# Patient Record
Sex: Male | Born: 1965 | Race: Black or African American | Hispanic: No | Marital: Single | Smoking: Current every day smoker
Health system: Southern US, Community
[De-identification: ages and names within clinical notes are randomized; demographics above are authoritative.]

## PROBLEM LIST (undated history)

## (undated) DIAGNOSIS — T286XXA Corrosion of esophagus, initial encounter: Secondary | ICD-10-CM

## (undated) DIAGNOSIS — K219 Gastro-esophageal reflux disease without esophagitis: Secondary | ICD-10-CM

## (undated) DIAGNOSIS — T6591XA Toxic effect of unspecified substance, accidental (unintentional), initial encounter: Secondary | ICD-10-CM

## (undated) DIAGNOSIS — F1910 Other psychoactive substance abuse, uncomplicated: Secondary | ICD-10-CM

---

## 1999-01-12 ENCOUNTER — Emergency Department (HOSPITAL_COMMUNITY): Admission: EM | Admit: 1999-01-12 | Discharge: 1999-01-13 | Payer: Self-pay | Admitting: Emergency Medicine

## 1999-01-13 ENCOUNTER — Encounter: Payer: Self-pay | Admitting: Emergency Medicine

## 1999-06-30 ENCOUNTER — Encounter: Payer: Self-pay | Admitting: Emergency Medicine

## 1999-06-30 ENCOUNTER — Inpatient Hospital Stay (HOSPITAL_COMMUNITY): Admission: EM | Admit: 1999-06-30 | Discharge: 1999-07-01 | Payer: Self-pay

## 1999-06-30 ENCOUNTER — Encounter: Payer: Self-pay | Admitting: General Surgery

## 1999-07-01 ENCOUNTER — Encounter: Payer: Self-pay | Admitting: General Surgery

## 2009-04-21 ENCOUNTER — Emergency Department (HOSPITAL_COMMUNITY): Admission: EM | Admit: 2009-04-21 | Discharge: 2009-04-21 | Payer: Self-pay | Admitting: Emergency Medicine

## 2009-10-01 ENCOUNTER — Emergency Department (HOSPITAL_COMMUNITY): Admission: EM | Admit: 2009-10-01 | Discharge: 2009-10-02 | Payer: Self-pay | Admitting: Emergency Medicine

## 2009-10-02 ENCOUNTER — Ambulatory Visit: Payer: Self-pay | Admitting: Psychiatry

## 2010-04-17 LAB — CBC
HCT: 45.5 % (ref 39.0–52.0)
Hemoglobin: 15.5 g/dL (ref 13.0–17.0)
MCH: 30.2 pg (ref 26.0–34.0)
MCHC: 34.1 g/dL (ref 30.0–36.0)
MCV: 88.6 fL (ref 78.0–100.0)

## 2010-04-17 LAB — DIFFERENTIAL
Basophils Relative: 1 % (ref 0–1)
Eosinophils Absolute: 0.5 10*3/uL (ref 0.0–0.7)
Eosinophils Relative: 7 % — ABNORMAL HIGH (ref 0–5)
Monocytes Absolute: 0.7 10*3/uL (ref 0.1–1.0)
Monocytes Relative: 10 % (ref 3–12)
Neutrophils Relative %: 60 % (ref 43–77)

## 2010-04-17 LAB — RAPID URINE DRUG SCREEN, HOSP PERFORMED
Amphetamines: NOT DETECTED
Barbiturates: NOT DETECTED
Benzodiazepines: NOT DETECTED
Cocaine: POSITIVE — AB
Opiates: NOT DETECTED
Tetrahydrocannabinol: NOT DETECTED

## 2010-04-17 LAB — BASIC METABOLIC PANEL
CO2: 29 mEq/L (ref 19–32)
Calcium: 9.5 mg/dL (ref 8.4–10.5)
Chloride: 105 mEq/L (ref 96–112)
Glucose, Bld: 79 mg/dL (ref 70–99)
Sodium: 139 mEq/L (ref 135–145)

## 2010-04-17 LAB — ETHANOL: Alcohol, Ethyl (B): <5 mg/dL (ref 0–10)

## 2010-04-27 LAB — COMPREHENSIVE METABOLIC PANEL WITH GFR
Albumin: 3.5 g/dL (ref 3.5–5.2)
Alkaline Phosphatase: 47 U/L (ref 39–117)
BUN: 7 mg/dL (ref 6–23)
CO2: 23 meq/L (ref 19–32)
Chloride: 108 meq/L (ref 96–112)
GFR calc non Af Amer: >60 mL/min (ref >60)
Glucose, Bld: 97 mg/dL (ref 70–99)
Potassium: 4.6 meq/L (ref 3.5–5.1)
Total Bilirubin: 0.5 mg/dL (ref 0.3–1.2)

## 2010-04-27 LAB — DIFFERENTIAL
Basophils Absolute: 0 K/uL (ref 0.0–0.1)
Basophils Relative: 0 % (ref 0–1)
Eosinophils Absolute: 1.2 10*3/uL — ABNORMAL HIGH (ref 0.0–0.7)
Eosinophils Relative: 13 % — ABNORMAL HIGH (ref 0–5)
Lymphocytes Relative: 22 % (ref 12–46)
Lymphs Abs: 2.1 10*3/uL (ref 0.7–4.0)
Monocytes Absolute: 0.7 K/uL (ref 0.1–1.0)
Monocytes Relative: 8 % (ref 3–12)
Neutro Abs: 5.2 K/uL (ref 1.7–7.7)
Neutrophils Relative %: 56 % (ref 43–77)

## 2010-04-27 LAB — CBC
HCT: 44.5 % (ref 39.0–52.0)
Hemoglobin: 14.7 g/dL (ref 13.0–17.0)
MCHC: 33 g/dL (ref 30.0–36.0)
MCV: 89.4 fL (ref 78.0–100.0)
Platelets: 201 10*3/uL (ref 150–400)
RBC: 4.98 MIL/uL (ref 4.22–5.81)
RDW: 14.2 % (ref 11.5–15.5)
WBC: 9.2 K/uL (ref 4.0–10.5)

## 2010-04-27 LAB — COMPREHENSIVE METABOLIC PANEL
ALT: 33 U/L (ref 0–53)
AST: 30 U/L (ref 0–37)
Calcium: 9 mg/dL (ref 8.4–10.5)
Creatinine, Ser: 1.07 mg/dL (ref 0.4–1.5)
GFR calc Af Amer: >60 mL/min (ref >60)
Sodium: 138 mEq/L (ref 135–145)
Total Protein: 6.8 g/dL (ref 6.0–8.3)

## 2010-04-27 LAB — LIPASE, BLOOD: Lipase: 38 U/L (ref 11–59)

## 2010-05-22 ENCOUNTER — Emergency Department (HOSPITAL_COMMUNITY)

## 2010-05-22 ENCOUNTER — Emergency Department (HOSPITAL_COMMUNITY)
Admission: EM | Admit: 2010-05-22 | Discharge: 2010-05-22 | Disposition: A | Discharge status: Home / Self Care | Attending: Emergency Medicine | Admitting: Emergency Medicine

## 2010-05-22 DIAGNOSIS — K219 Gastro-esophageal reflux disease without esophagitis: Secondary | ICD-10-CM | POA: Insufficient documentation

## 2010-05-22 DIAGNOSIS — M7989 Other specified soft tissue disorders: Secondary | ICD-10-CM | POA: Insufficient documentation

## 2010-05-22 DIAGNOSIS — J45909 Unspecified asthma, uncomplicated: Secondary | ICD-10-CM | POA: Insufficient documentation

## 2010-05-22 DIAGNOSIS — R51 Headache: Secondary | ICD-10-CM | POA: Insufficient documentation

## 2010-05-22 DIAGNOSIS — M542 Cervicalgia: Secondary | ICD-10-CM | POA: Insufficient documentation

## 2010-05-22 DIAGNOSIS — IMO0002 Reserved for concepts with insufficient information to code with codable children: Secondary | ICD-10-CM | POA: Insufficient documentation

## 2010-05-22 DIAGNOSIS — Y929 Unspecified place or not applicable: Secondary | ICD-10-CM | POA: Insufficient documentation

## 2010-05-22 DIAGNOSIS — S060X9A Concussion with loss of consciousness of unspecified duration, initial encounter: Secondary | ICD-10-CM | POA: Insufficient documentation

## 2010-05-22 DIAGNOSIS — S61209A Unspecified open wound of unspecified finger without damage to nail, initial encounter: Secondary | ICD-10-CM | POA: Insufficient documentation

## 2010-05-22 DIAGNOSIS — S60949A Unspecified superficial injury of unspecified finger, initial encounter: Secondary | ICD-10-CM | POA: Insufficient documentation

## 2010-05-22 DIAGNOSIS — R5381 Other malaise: Secondary | ICD-10-CM | POA: Insufficient documentation

## 2010-05-22 LAB — BASIC METABOLIC PANEL
BUN: 6 mg/dL (ref 6–23)
CO2: 20 mEq/L (ref 19–32)
Chloride: 105 mEq/L (ref 96–112)
Glucose, Bld: 85 mg/dL (ref 70–99)
Potassium: 3.8 mEq/L (ref 3.5–5.1)
Sodium: 139 mEq/L (ref 135–145)

## 2010-05-22 LAB — CBC
HCT: 46.7 % (ref 39.0–52.0)
MCH: 29.6 pg (ref 26.0–34.0)
MCV: 85.2 fL (ref 78.0–100.0)
Platelets: 144 10*3/uL — ABNORMAL LOW (ref 150–400)
RBC: 5.48 MIL/uL (ref 4.22–5.81)
WBC: 13.2 10*3/uL — ABNORMAL HIGH (ref 4.0–10.5)

## 2010-05-22 LAB — RAPID URINE DRUG SCREEN, HOSP PERFORMED
Amphetamines: NOT DETECTED
Benzodiazepines: NOT DETECTED
Cocaine: POSITIVE — AB
Tetrahydrocannabinol: POSITIVE — AB

## 2010-05-22 LAB — DIFFERENTIAL
Eosinophils Absolute: 0.4 10*3/uL (ref 0.0–0.7)
Eosinophils Relative: 3 % (ref 0–5)
Lymphocytes Relative: 11 % — ABNORMAL LOW (ref 12–46)
Lymphs Abs: 1.4 10*3/uL (ref 0.7–4.0)
Monocytes Relative: 6 % (ref 3–12)
Neutrophils Relative %: 80 % — ABNORMAL HIGH (ref 43–77)
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
## 2013-01-23 ENCOUNTER — Emergency Department (HOSPITAL_COMMUNITY)
Admission: EM | Admit: 2013-01-23 | Discharge: 2013-01-23 | Disposition: A | Discharge status: Home / Self Care | Attending: Emergency Medicine | Admitting: Emergency Medicine

## 2013-01-23 ENCOUNTER — Encounter (HOSPITAL_COMMUNITY): Payer: Self-pay | Admitting: Emergency Medicine

## 2013-01-23 DIAGNOSIS — R000 Tachycardia, unspecified: Secondary | ICD-10-CM | POA: Insufficient documentation

## 2013-01-23 DIAGNOSIS — R042 Hemoptysis: Secondary | ICD-10-CM | POA: Insufficient documentation

## 2013-01-23 DIAGNOSIS — Z79899 Other long term (current) drug therapy: Secondary | ICD-10-CM | POA: Insufficient documentation

## 2013-01-23 DIAGNOSIS — K219 Gastro-esophageal reflux disease without esophagitis: Secondary | ICD-10-CM | POA: Insufficient documentation

## 2013-01-23 DIAGNOSIS — K2970 Gastritis, unspecified, without bleeding: Secondary | ICD-10-CM

## 2013-01-23 DIAGNOSIS — R7989 Other specified abnormal findings of blood chemistry: Secondary | ICD-10-CM

## 2013-01-23 DIAGNOSIS — R12 Heartburn: Secondary | ICD-10-CM

## 2013-01-23 HISTORY — DX: Gastro-esophageal reflux disease without esophagitis: K21.9

## 2013-01-23 LAB — COMPREHENSIVE METABOLIC PANEL
ALT: 66 U/L — ABNORMAL HIGH (ref 0–53)
AST: 43 U/L — ABNORMAL HIGH (ref 0–37)
Albumin: 4 g/dL (ref 3.5–5.2)
CO2: 24 mEq/L (ref 19–32)
Chloride: 105 mEq/L (ref 96–112)
Creatinine, Ser: 1.19 mg/dL (ref 0.50–1.35)
Potassium: 3.7 mEq/L (ref 3.5–5.1)
Sodium: 141 mEq/L (ref 135–145)
Total Bilirubin: 0.4 mg/dL (ref 0.3–1.2)

## 2013-01-23 LAB — CBC
MCV: 83.8 fL (ref 78.0–100.0)
Platelets: 209 10*3/uL (ref 150–400)
RBC: 5.2 MIL/uL (ref 4.22–5.81)
RDW: 14.1 % (ref 11.5–15.5)
WBC: 8.7 10*3/uL (ref 4.0–10.5)

## 2013-01-23 MED ORDER — ONDANSETRON HCL 4 MG/2ML IJ SOLN
4.0000 mg | Freq: Once | INTRAMUSCULAR | Status: AC
  Administered 2013-01-23: 4 mg via INTRAVENOUS
  Filled 2013-01-23: qty 2

## 2013-01-23 MED ORDER — OMEPRAZOLE 20 MG PO CPDR: 20.0000 mg | DELAYED_RELEASE_CAPSULE | Freq: Every day | ORAL | Status: DC

## 2013-01-23 MED ORDER — KETOROLAC TROMETHAMINE 30 MG/ML IJ SOLN
30.0000 mg | Freq: Once | INTRAMUSCULAR | Status: AC
  Administered 2013-01-23: 30 mg via INTRAVENOUS
  Filled 2013-01-23: qty 1

## 2013-01-23 MED ORDER — FAMOTIDINE IN NACL 20-0.9 MG/50ML-% IV SOLN
20.0000 mg | Freq: Once | INTRAVENOUS | Status: AC
  Administered 2013-01-23: 20 mg via INTRAVENOUS
  Filled 2013-01-23: qty 50

## 2013-01-23 NOTE — ED Notes (Signed)
Pt c/o heartburn bc he has been out of normal meds and taking different ones that arent working. Pt also vomiting blood that just started now total one time.

## 2013-01-23 NOTE — ED Notes (Signed)
Pt tolerated fluid and crackers. No nausea or vomiting. Pt reports that he feels better.

## 2013-01-23 NOTE — Progress Notes (Signed)
[facility] provided pt with a list of primary care resources, [facility] information, and a [facility] Orange Card application.  °
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
## 2013-01-23 NOTE — ED Provider Notes (Signed)
Medical screening examination/treatment/procedure(s) were performed by non-physician practitioner and as supervising physician I was immediately available for consultation/collaboration.  EKG Interpretation   None        Ethelda Chick, MD 01/23/13 1343

## 2013-03-20 ENCOUNTER — Emergency Department (HOSPITAL_COMMUNITY)

## 2013-03-20 ENCOUNTER — Emergency Department (HOSPITAL_COMMUNITY)
Admission: EM | Admit: 2013-03-20 | Discharge: 2013-03-21 | Disposition: A | Discharge status: Home / Self Care | Attending: Emergency Medicine | Admitting: Emergency Medicine

## 2013-03-20 ENCOUNTER — Encounter (HOSPITAL_COMMUNITY): Payer: Self-pay | Admitting: Emergency Medicine

## 2013-03-20 DIAGNOSIS — R112 Nausea with vomiting, unspecified: Secondary | ICD-10-CM

## 2013-03-20 DIAGNOSIS — Z79899 Other long term (current) drug therapy: Secondary | ICD-10-CM | POA: Insufficient documentation

## 2013-03-20 DIAGNOSIS — K922 Gastrointestinal hemorrhage, unspecified: Secondary | ICD-10-CM | POA: Insufficient documentation

## 2013-03-20 DIAGNOSIS — K2990 Gastroduodenitis, unspecified, without bleeding: Principal | ICD-10-CM

## 2013-03-20 DIAGNOSIS — K219 Gastro-esophageal reflux disease without esophagitis: Secondary | ICD-10-CM | POA: Insufficient documentation

## 2013-03-20 DIAGNOSIS — K2970 Gastritis, unspecified, without bleeding: Secondary | ICD-10-CM | POA: Insufficient documentation

## 2013-03-20 LAB — COMPREHENSIVE METABOLIC PANEL
ALT: 35 U/L (ref 0–53)
AST: 30 U/L (ref 0–37)
Albumin: 4.7 g/dL (ref 3.5–5.2)
Alkaline Phosphatase: 53 U/L (ref 39–117)
BUN: 10 mg/dL (ref 6–23)
CO2: 24 meq/L (ref 19–32)
CREATININE: 1.43 mg/dL — AB (ref 0.50–1.35)
Calcium: 10.3 mg/dL (ref 8.4–10.5)
Chloride: 96 mEq/L (ref 96–112)
GFR calc Af Amer: 67 mL/min — ABNORMAL LOW (ref >90)
GFR, EST NON AFRICAN AMERICAN: 58 mL/min — AB (ref >90)
GLUCOSE: 132 mg/dL — AB (ref 70–99)
Potassium: 3.4 mEq/L — ABNORMAL LOW (ref 3.7–5.3)
Sodium: 140 mEq/L (ref 137–147)
Total Bilirubin: 0.5 mg/dL (ref 0.3–1.2)
Total Protein: 9.3 g/dL — ABNORMAL HIGH (ref 6.0–8.3)

## 2013-03-20 LAB — CBC WITH DIFFERENTIAL/PLATELET
Basophils Absolute: 0 10*3/uL (ref 0.0–0.1)
Basophils Relative: 0 % (ref 0–1)
Eosinophils Absolute: 0.6 10*3/uL (ref 0.0–0.7)
Eosinophils Relative: 4 % (ref 0–5)
HEMATOCRIT: 45.3 % (ref 39.0–52.0)
HEMOGLOBIN: 15.7 g/dL (ref 13.0–17.0)
Lymphocytes Relative: 16 % (ref 12–46)
Lymphs Abs: 2.1 10*3/uL (ref 0.7–4.0)
MCH: 28.8 pg (ref 26.0–34.0)
MCHC: 34.7 g/dL (ref 30.0–36.0)
MCV: 83.1 fL (ref 78.0–100.0)
MONO ABS: 0.9 10*3/uL (ref 0.1–1.0)
MONOS PCT: 7 % (ref 3–12)
NEUTROS ABS: 9.5 10*3/uL — AB (ref 1.7–7.7)
Neutrophils Relative %: 73 % (ref 43–77)
Platelets: 242 10*3/uL (ref 150–400)
RBC: 5.45 MIL/uL (ref 4.22–5.81)
RDW: 13.3 % (ref 11.5–15.5)
WBC: 13.1 10*3/uL — ABNORMAL HIGH (ref 4.0–10.5)

## 2013-03-20 LAB — LIPASE, BLOOD: Lipase: 52 U/L (ref 11–59)

## 2013-03-20 LAB — OCCULT BLOOD GASTRIC / DUODENUM (SPECIMEN CUP)
Occult Blood, Gastric: POSITIVE — AB
PH, GASTRIC: 3

## 2013-03-20 MED ORDER — SODIUM CHLORIDE 0.9 % IV BOLUS (SEPSIS)
1000.0000 mL | Freq: Once | INTRAVENOUS | Status: AC
  Administered 2013-03-20: 1000 mL via INTRAVENOUS

## 2013-03-20 MED ORDER — GI COCKTAIL ~~LOC~~
30.0000 mL | Freq: Once | ORAL | Status: AC
  Administered 2013-03-20: 30 mL via ORAL
  Filled 2013-03-20: qty 30

## 2013-03-20 MED ORDER — PANTOPRAZOLE SODIUM 40 MG IV SOLR
40.0000 mg | Freq: Once | INTRAVENOUS | Status: AC
  Administered 2013-03-20: 40 mg via INTRAVENOUS
  Filled 2013-03-20: qty 40

## 2013-03-20 NOTE — ED Notes (Signed)
Bed: WA18 Expected date:  Expected time:  Means of arrival:  Comments: EMS-N/V 

## 2013-03-20 NOTE — ED Notes (Signed)
Per EMS pt reports having nausea/vomiting last night. States he vomited about 30 times was given Zofran 4mg  IV in route. Denies diarrhea or fever. Pt has hx of acid reflux and ran out of medication.

## 2013-03-20 NOTE — ED Provider Notes (Signed)
CSN: [id number]     Arrival date & time 03/20/13  2207 History   First MD Initiated Contact with Patient 03/20/13 2209     Chief Complaint  Patient presents with  . Nausea  . Emesis     (Consider location/radiation/quality/duration/timing/severity/associated sxs/prior Treatment) HPI Kurt Ramirez is a 48 y.o. male who presents to emergency department complaining of nausea, vomiting, abdominal pain. Patient states that his symptoms began last night. He states he vomited more than 30 times today. Pain is epigastric. He denies any changes in his bowels. He states last bowel movement was yesterday morning and was normal. He states he has had similar episodes in the past. He states he has hiatal hernia and gastric reflux for which he takes omeprazole daily. States he did not take it today because he was staying at his friend's house. He states that he did have some dark blood in his emesis. He denies any blood in his stool. He did not take any medications today. He states he is unable to eat or drink anything for vomiting. He denies any fever. He denies any nasal congestion, sore throat, cough, chest pain, back pain. No urinary symptoms. Nothing making his symptoms better or worse  Past Medical History  Diagnosis Date  . Acid reflux    History reviewed. No pertinent past surgical history. History reviewed. No pertinent family history. History  Substance Use Topics  . Smoking status: Never Smoker   . Smokeless tobacco: Never Used  . Alcohol Use: No    Review of Systems  Constitutional: Negative for fever and chills.  Respiratory: Negative for cough, chest tightness and shortness of breath.   Cardiovascular: Negative for chest pain, palpitations and leg swelling.  Gastrointestinal: Positive for nausea, vomiting and abdominal pain. Negative for diarrhea, blood in stool and abdominal distention.  Genitourinary: Negative for dysuria, urgency, frequency and hematuria.  Musculoskeletal: Negative  for arthralgias, myalgias, neck pain and neck stiffness.  Skin: Negative for rash.  Allergic/Immunologic: Negative for immunocompromised state.  Neurological: Negative for dizziness, weakness, light-headedness, numbness and headaches.      Allergies  Review of patient's allergies indicates no known allergies.  Home Medications   Current Outpatient Rx  Name  Route  Sig  Dispense  Refill  . ibuprofen (ADVIL,MOTRIN) 200 MG tablet   Oral   Take 400 mg by mouth every 6 (six) hours as needed for headache or moderate pain.         Marland Kitchen. omeprazole (PRILOSEC) 20 MG capsule   Oral   Take 20 mg by mouth daily.          BP 124/96  Pulse 116  Temp(Src) 98.3 F (36.8 C) (Oral)  Resp 20  SpO2 97% Physical Exam  Nursing note and vitals reviewed. Constitutional: He appears well-developed and well-nourished. No distress.  HENT:  Head: Normocephalic and atraumatic.  Eyes: Conjunctivae are normal.  Neck: Neck supple.  Cardiovascular: Normal rate, regular rhythm and normal heart sounds.   Pulmonary/Chest: Effort normal. No respiratory distress. He has no wheezes. He has no rales.  Abdominal: Soft. Bowel sounds are normal. He exhibits no distension. There is no tenderness. There is no rebound and no guarding.  Epigastric and left upper quadrant tenderness  Musculoskeletal: He exhibits no edema.  Neurological: He is alert.  Skin: Skin is warm and dry.    ED Course  Procedures (including critical care time) Labs Review Labs Reviewed  CBC WITH DIFFERENTIAL - Abnormal; Notable for the following:  WBC 13.1 (*)    Neutro Abs 9.5 (*)    All other components within normal limits  COMPREHENSIVE METABOLIC PANEL - Abnormal; Notable for the following:    Potassium 3.4 (*)    Glucose, Bld 132 (*)    Creatinine, Ser 1.43 (*)    Total Protein 9.3 (*)    GFR calc non Af Amer 58 (*)    GFR calc Af Amer 67 (*)    All other components within normal limits  OCCULT BLOOD GASTRIC / DUODENUM  (SPECIMEN CUP) - Abnormal; Notable for the following:    Occult Blood, Gastric POSITIVE (*)    All other components within normal limits  LIPASE, BLOOD   Imaging Review Dg Abd Acute W/chest  03/20/2013   CLINICAL DATA:  Heartburn, acid reflux  EXAM: ACUTE ABDOMEN SERIES (ABDOMEN 2 VIEW & CHEST 1 VIEW)  COMPARISON:  None.  FINDINGS: Normal mediastinum and cardiac silhouette. Normal pulmonary vasculature. No evidence of effusion, infiltrate, or pneumothorax. No acute bony abnormality.  No dilated large or small bowel. Moderate volume stool in the colon. No pathologic calcifications.  IMPRESSION: No acute cardiopulmonary process.  No bowel obstruction or free air.   Electronically Signed   By: Genevive Bi M.D.   On: 03/20/2013 23:11    EKG Interpretation   None       MDM   Final diagnoses:  Nausea & vomiting  Gastritis  Upper gastrointestinal bleed    Pt with dark emesis, gastric occult positive. Upper abdominal pain. No bright red blood. Concerning for gastritis vs PUD. Will get labs, Acute abd series, gi cocktail and protonix ordered.   11:53 PM Pt feeling much better after medications emesis resolved. Abdomen reassessed, soft, non tender. no free air on x-rays. Pt tolerating po fluids. H&H normal. Concerning for possible peptic ulcer disease. Will d/c home with prilosec, carafate, follow up with GI.     Lottie Mussel, PA-C 03/21/13 0101

## 2013-03-21 MED ORDER — OMEPRAZOLE 20 MG PO CPDR: 20.0000 mg | DELAYED_RELEASE_CAPSULE | Freq: Every day | ORAL | Status: DC

## 2013-03-21 MED ORDER — SODIUM CHLORIDE 0.9 % IV BOLUS (SEPSIS)
1000.0000 mL | Freq: Once | INTRAVENOUS | Status: AC
  Administered 2013-03-21: 1000 mL via INTRAVENOUS

## 2013-03-21 MED ORDER — SUCRALFATE 1 G PO TABS: 1.0000 g | ORAL_TABLET | Freq: Three times a day (TID) | ORAL | Status: DC

## 2013-03-21 NOTE — Discharge Instructions (Signed)
Continue prilosec and carafate. No alcohol. Stop smoking. No NSAID medications. Limit caffeine. Follow up with gastroenterologist. Return if symptoms not improving or worsening.   Peptic Ulcer A peptic ulcer is a sore in the lining of in your esophagus (esophageal ulcer), stomach (gastric ulcer), or in the first part of your small intestine (duodenal ulcer). The ulcer causes erosion into the deeper tissue. CAUSES  Normally, the lining of the stomach and the small intestine protects itself from the acid that digests food. The protective lining can be damaged by:  An infection caused by a bacterium called Helicobacter pylori (H. pylori).  Regular use of nonsteroidal anti-inflammatory drugs (NSAIDs), such as ibuprofen or aspirin.  Smoking tobacco. Other risk factors include being older than 50, drinking alcohol excessively, and having a family history of ulcer disease.  SYMPTOMS   Burning pain or gnawing in the area between the chest and the belly button.  Heartburn.  Nausea and vomiting.  Bloating. The pain can be worse on an empty stomach and at night. If the ulcer results in bleeding, it can cause:  Black, tarry stools.  Vomiting of bright red blood.  Vomiting of coffee ground looking materials. DIAGNOSIS  A diagnosis is usually made based upon your history and an exam. Other tests and procedures may be performed to find the cause of the ulcer. Finding a cause will help determine the best treatment. Tests and procedures may include:  Blood tests, stool tests, or breath tests to check for the bacterium H. pylori.  An upper gastrointestinal (GI) series of the esophagus, stomach, and small intestine.  An endoscopy to examine the esophagus, stomach, and small intestine.  A biopsy. TREATMENT  Treatment may include:  Eliminating the cause of the ulcer, such as smoking, NSAIDs, or alcohol.  Medicines to reduce the amount of acid in your digestive tract.  Antibiotic medicines if  the ulcer is caused by the H. pylori bacterium.  An upper endoscopy to treat a bleeding ulcer.  Surgery if the bleeding is severe or if the ulcer created a hole somewhere in the digestive system. HOME CARE INSTRUCTIONS   Avoid tobacco, alcohol, and caffeine. Smoking can increase the acid in the stomach, and continued smoking will impair the healing of ulcers.  Avoid foods and drinks that seem to cause discomfort or aggravate your ulcer.  Only take medicines as directed by your caregiver. Do not substitute over-the-counter medicines for prescription medicines without talking to your caregiver.  Keep any follow-up appointments and tests as directed. SEEK MEDICAL CARE IF:   Your do not improve within 7 days of starting treatment.  You have ongoing indigestion or heartburn. SEEK IMMEDIATE MEDICAL CARE IF:   You have sudden, sharp, or persistent abdominal pain.  You have bloody or dark black, tarry stools.  You vomit blood or vomit that looks like coffee grounds.  You become light headed, weak, or feel faint.  You become sweaty or clammy. MAKE SURE YOU:   Understand these instructions.  Will watch your condition.  Will get help right away if you are not doing well or get worse. Document Released: 01/16/2000 Document Revised: 10/13/2011 Document Reviewed: 08/18/2011 [facility] IncorporatedExitCare Patient Information 2014 [place], [place].

## 2013-03-23 NOTE — ED Provider Notes (Signed)
Medical screening examination/treatment/procedure(s) were performed by non-physician practitioner and as supervising physician I was immediately available for consultation/collaboration.  EKG Interpretation   None         Rolan BuccoMelanie Aivy Akter, MD 03/23/13 1506

## 2015-01-04 ENCOUNTER — Encounter (HOSPITAL_COMMUNITY): Payer: Self-pay | Admitting: *Deleted

## 2015-01-04 ENCOUNTER — Emergency Department (HOSPITAL_COMMUNITY)

## 2015-01-04 ENCOUNTER — Emergency Department (HOSPITAL_COMMUNITY)
Admission: EM | Admit: 2015-01-04 | Discharge: 2015-01-04 | Disposition: A | Discharge status: Home / Self Care | Attending: Emergency Medicine | Admitting: Emergency Medicine

## 2015-01-04 DIAGNOSIS — R112 Nausea with vomiting, unspecified: Secondary | ICD-10-CM

## 2015-01-04 DIAGNOSIS — R1011 Right upper quadrant pain: Secondary | ICD-10-CM | POA: Insufficient documentation

## 2015-01-04 DIAGNOSIS — E876 Hypokalemia: Secondary | ICD-10-CM

## 2015-01-04 DIAGNOSIS — R1013 Epigastric pain: Secondary | ICD-10-CM | POA: Insufficient documentation

## 2015-01-04 DIAGNOSIS — K219 Gastro-esophageal reflux disease without esophagitis: Secondary | ICD-10-CM | POA: Insufficient documentation

## 2015-01-04 DIAGNOSIS — R000 Tachycardia, unspecified: Secondary | ICD-10-CM | POA: Insufficient documentation

## 2015-01-04 DIAGNOSIS — Z79899 Other long term (current) drug therapy: Secondary | ICD-10-CM | POA: Insufficient documentation

## 2015-01-04 LAB — COMPREHENSIVE METABOLIC PANEL
ALT: 42 U/L (ref 17–63)
AST: 38 U/L (ref 15–41)
Albumin: 4.3 g/dL (ref 3.5–5.0)
Alkaline Phosphatase: 45 U/L (ref 38–126)
Anion gap: 14 (ref 5–15)
BUN: 6 mg/dL (ref 6–20)
CHLORIDE: 94 mmol/L — AB (ref 101–111)
CO2: 30 mmol/L (ref 22–32)
Calcium: 9.7 mg/dL (ref 8.9–10.3)
Creatinine, Ser: 1.31 mg/dL — ABNORMAL HIGH (ref 0.61–1.24)
Glucose, Bld: 132 mg/dL — ABNORMAL HIGH (ref 65–99)
Potassium: 2.9 mmol/L — ABNORMAL LOW (ref 3.5–5.1)
Sodium: 138 mmol/L (ref 135–145)
Total Bilirubin: 0.7 mg/dL (ref 0.3–1.2)
Total Protein: 8.9 g/dL — ABNORMAL HIGH (ref 6.5–8.1)

## 2015-01-04 LAB — CBC
HCT: 41.2 % (ref 39.0–52.0)
Hemoglobin: 13.7 g/dL (ref 13.0–17.0)
MCH: 25.8 pg — ABNORMAL LOW (ref 26.0–34.0)
MCHC: 33.3 g/dL (ref 30.0–36.0)
MCV: 77.6 fL — AB (ref 78.0–100.0)
Platelets: 349 10*3/uL (ref 150–400)
RBC: 5.31 MIL/uL (ref 4.22–5.81)
RDW: 16 % — ABNORMAL HIGH (ref 11.5–15.5)
WBC: 14.7 10*3/uL — AB (ref 4.0–10.5)

## 2015-01-04 LAB — URINALYSIS, ROUTINE W REFLEX MICROSCOPIC
Bilirubin Urine: NEGATIVE
GLUCOSE, UA: NEGATIVE mg/dL
Hgb urine dipstick: NEGATIVE
Ketones, ur: 15 mg/dL — AB
LEUKOCYTES UA: NEGATIVE
Nitrite: NEGATIVE
PROTEIN: NEGATIVE mg/dL
Specific Gravity, Urine: 1.005 (ref 1.005–1.030)
pH: 7.5 (ref 5.0–8.0)

## 2015-01-04 LAB — LIPASE, BLOOD: LIPASE: 39 U/L (ref 11–51)

## 2015-01-04 MED ORDER — PANTOPRAZOLE SODIUM 40 MG IV SOLR
40.0000 mg | INTRAVENOUS | Status: AC
  Administered 2015-01-04: 40 mg via INTRAVENOUS
  Filled 2015-01-04: qty 40

## 2015-01-04 MED ORDER — SUCRALFATE 1 G PO TABS: 1.0000 g | ORAL_TABLET | Freq: Three times a day (TID) | ORAL | Status: DC

## 2015-01-04 MED ORDER — FAMOTIDINE IN NACL 20-0.9 MG/50ML-% IV SOLN
20.0000 mg | Freq: Once | INTRAVENOUS | Status: AC
  Administered 2015-01-04: 20 mg via INTRAVENOUS
  Filled 2015-01-04: qty 50

## 2015-01-04 MED ORDER — SODIUM CHLORIDE 0.9 % IV BOLUS (SEPSIS)
1000.0000 mL | Freq: Once | INTRAVENOUS | Status: AC
  Administered 2015-01-04: 1000 mL via INTRAVENOUS

## 2015-01-04 MED ORDER — OMEPRAZOLE 20 MG PO CPDR: 20.0000 mg | DELAYED_RELEASE_CAPSULE | Freq: Every day | ORAL | Status: DC

## 2015-01-04 MED ORDER — ONDANSETRON HCL 4 MG/2ML IJ SOLN
4.0000 mg | Freq: Once | INTRAMUSCULAR | Status: AC
  Administered 2015-01-04: 4 mg via INTRAVENOUS
  Filled 2015-01-04: qty 2

## 2015-01-04 MED ORDER — GI COCKTAIL ~~LOC~~
30.0000 mL | Freq: Once | ORAL | Status: AC
  Administered 2015-01-04: 30 mL via ORAL
  Filled 2015-01-04: qty 30

## 2015-01-04 MED ORDER — MAGNESIUM SULFATE 2 GM/50ML IV SOLN
2.0000 g | INTRAVENOUS | Status: AC
  Administered 2015-01-04: 2 g via INTRAVENOUS
  Filled 2015-01-04: qty 50

## 2015-01-04 MED ORDER — POTASSIUM CHLORIDE 10 MEQ/100ML IV SOLN
10.0000 meq | Freq: Once | INTRAVENOUS | Status: AC
  Administered 2015-01-04: 10 meq via INTRAVENOUS
  Filled 2015-01-04: qty 100

## 2015-01-04 NOTE — ED Provider Notes (Addendum)
CSN: [id number]     Arrival date & time 01/04/15  0229 History   First MD Initiated Contact with Patient 01/04/15 0408     Chief Complaint  Patient presents with  . Abdominal Pain     (Consider location/radiation/quality/duration/timing/severity/associated sxs/prior Treatment) HPI Comments: 49 year old male with a history of severe acid reflux, this is a constant problem for him when he does not take his medications. He has been out of his acid reflux medication over the last couple of days, he was also drinking alcohol at 9:00 this morning, he denies being an alcoholic but states that he does have occasional where he drinks heavily. He denies back pain, leg swelling, dysuria or diarrhea but has had multiple episodes of vomiting, some of which has had a small amount of blood in it. The symptoms are persistent throughout the last 2 days, worse with drinking alcohol  Patient is a 49 y.o. male presenting with abdominal pain. The history is provided by the patient.  Abdominal Pain   Past Medical History  Diagnosis Date  . Acid reflux    History reviewed. No pertinent past surgical history. No family history on file. Social History  Substance Use Topics  . Smoking status: Never Smoker   . Smokeless tobacco: Never Used  . Alcohol Use: Yes    Review of Systems  Gastrointestinal: Positive for abdominal pain.  All other systems reviewed and are negative.     Allergies  Review of patient's allergies indicates no known allergies.  Home Medications   Prior to Admission medications   Medication Sig Start Date End Date Taking? Authorizing Provider  omeprazole (PRILOSEC) 20 MG capsule Take 1 capsule (20 mg total) by mouth daily. 01/04/15   Eber Hong, MD  sucralfate (CARAFATE) 1 G tablet Take 1 tablet (1 g total) by mouth 4 (four) times daily -  with meals and at bedtime. 01/04/15   Eber Hong, MD   BP 120/94 mmHg  Pulse 92  Temp(Src) 99.1 F (37.3 C) (Oral)  Resp 23  Ht   (1.6 m)  Wt 129 lb (58.514 kg)  BMI 22.86 kg/m2  SpO2 98% Physical Exam  Constitutional: He appears well-developed and well-nourished. No distress.  HENT:  Head: Normocephalic and atraumatic.  Mouth/Throat: Oropharynx is clear and moist. No oropharyngeal exudate.  Eyes: Conjunctivae and EOM are normal. Pupils are equal, round, and reactive to light. Right eye exhibits no discharge. Left eye exhibits no discharge. No scleral icterus.  Neck: Normal range of motion. Neck supple. No JVD present. No thyromegaly present.  Cardiovascular: Regular rhythm, normal heart sounds and intact distal pulses.  Exam reveals no gallop and no friction rub.   No murmur heard. tachycardia  Pulmonary/Chest: Effort normal and breath sounds normal. No respiratory distress. He has no wheezes. He has no rales.  Abdominal: Soft. Bowel sounds are normal. He exhibits no distension and no mass. There is tenderness.  Focal tenderness in the right upper quadrant and epigastrium, no guarding or peritoneal signs, no Murphy sign, no pain at McBurney's point  Musculoskeletal: Normal range of motion. He exhibits no edema or tenderness.  Lymphadenopathy:    He has no cervical adenopathy.  Neurological: He is alert. Coordination normal.  Skin: Skin is warm and dry. No rash noted. No erythema.  Psychiatric: He has a normal mood and affect. His behavior is normal.  Nursing note and vitals reviewed.   ED Course  Procedures (including critical care time) Labs Review Labs Reviewed  COMPREHENSIVE METABOLIC  PANEL - Abnormal; Notable for the following:    Potassium 2.9 (*)    Chloride 94 (*)    Glucose, Bld 132 (*)    Creatinine, Ser 1.31 (*)    Total Protein 8.9 (*)    All other components within normal limits  CBC - Abnormal; Notable for the following:    WBC 14.7 (*)    MCV 77.6 (*)    MCH 25.8 (*)    RDW 16.0 (*)    All other components within normal limits  URINALYSIS, ROUTINE W REFLEX MICROSCOPIC (NOT [facility]) -  Abnormal; Notable for the following:    APPearance HAZY (*)    Ketones, ur 15 (*)    All other components within normal limits  LIPASE, BLOOD    Imaging Review Kore[place] Abdomen Complete  01/04/2015  CLINICAL DATA:  Abdominal pain. EXAM: ULTRASOUND ABDOMEN COMPLETE COMPARISON:  None. FINDINGS: Gallbladder: No gallstones or wall thickening visualized. No sonographic Murphy sign noted. Common bile duct: Diameter: 2.3 mm Liver: No focal lesion identified. Within normal limits in parenchymal echogenicity. IVC: No abnormality visualized. Pancreas: Not visible. Spleen: Size and appearance within normal limits. Right Kidney: Length: 9.6 cm. Echogenicity within normal limits. No hydronephrosis. 2 cm upper-mid pole cyst. Left Kidney: Length: 9.5 cm. Echogenicity within normal limits. No mass or hydronephrosis visualized. Abdominal aorta: No aneurysm visualized. Other findings: None. IMPRESSION: Nondiagnostic views of the pancreas.  Otherwise unremarkable. Electronically Signed   By: Ellery Plunkaniel R Mitchell M.D.   On: 01/04/2015 06:02   I have personally reviewed and evaluated these images and lab results as part of my medical decision-making.   EKG Interpretation   Date/Time:  Saturday January 04 2015 05:10:39 EST Ventricular Rate:  111 PR Interval:  98 QRS Duration: 79 QT Interval:  350 QTC Calculation: 476 R Axis:   66 Text Interpretation:  Sinus tachycardia Probable left atrial enlargement  Anteroseptal infarct, old Minimal ST depression, inferior leads Since last  tracing rate faster Confirmed by Sho Salguero  MD, Sabri Teal ([id number]) on 01/04/2015  5:23:17 AM      MDM   Final diagnoses:  Non-intractable vomiting with nausea, vomiting of unspecified type  Hypokalemia    The patient is tachycardic, his labs show hypokalemia which is not surprising giving his vomiting, mild renal insufficiency and a leukocytosis. He does have ketones, will check ultrasound, fluids, potassium repletion..  Given K replacement,  fluids and antiemetics as well as H2 and PPI, pt much improved.  Meds given in ED:  Medications  magnesium sulfate IVPB 2 g 50 mL (not administered)  sodium chloride 0.9 % bolus 1,000 mL (0 mLs Intravenous Stopped 01/04/15 0707)  sodium chloride 0.9 % bolus 1,000 mL (0 mLs Intravenous Stopped 01/04/15 0720)  ondansetron (ZOFRAN) injection 4 mg (4 mg Intravenous Given 01/04/15 0524)  famotidine (PEPCID) IVPB 20 mg premix (0 mg Intravenous Stopped 01/04/15 0539)  pantoprazole (PROTONIX) injection 40 mg (40 mg Intravenous Given 01/04/15 0525)  gi cocktail (Maalox,Lidocaine,Donnatal) (30 mLs Oral Given 01/04/15 0523)  ondansetron (ZOFRAN) injection 4 mg (4 mg Intravenous Given 01/04/15 0610)  potassium chloride 10 mEq in 100 mL IVPB (10 mEq Intravenous Restarted 01/04/15 [id number])    New Prescriptions   No medications on file        Eber HongBrian Sheliah Fiorillo, MD 01/04/15 0445  Eber HongBrian Crystallee Werden, MD 01/04/15 [phone]

## 2015-01-04 NOTE — ED Notes (Signed)
Pt arrives via EMS c/o acid reflux. States that he was drinking alcohol this morning and then vomited and missed his acid reflux pill. C/o acid reflux pains and burping since Wednesday.

## 2015-01-04 NOTE — Discharge Instructions (Signed)

## 2016-04-15 ENCOUNTER — Emergency Department (HOSPITAL_COMMUNITY)
Admission: EM | Admit: 2016-04-15 | Discharge: 2016-04-15 | Disposition: A | Discharge status: Home / Self Care | Payer: Self-pay | Attending: Emergency Medicine | Admitting: Emergency Medicine

## 2016-04-15 ENCOUNTER — Emergency Department (HOSPITAL_COMMUNITY): Payer: Self-pay

## 2016-04-15 ENCOUNTER — Encounter (HOSPITAL_COMMUNITY): Payer: Self-pay | Admitting: Emergency Medicine

## 2016-04-15 DIAGNOSIS — F10920 Alcohol use, unspecified with intoxication, uncomplicated: Secondary | ICD-10-CM

## 2016-04-15 DIAGNOSIS — R1013 Epigastric pain: Secondary | ICD-10-CM | POA: Insufficient documentation

## 2016-04-15 DIAGNOSIS — R1110 Vomiting, unspecified: Secondary | ICD-10-CM | POA: Insufficient documentation

## 2016-04-15 DIAGNOSIS — F10120 Alcohol abuse with intoxication, uncomplicated: Secondary | ICD-10-CM | POA: Insufficient documentation

## 2016-04-15 DIAGNOSIS — Z79899 Other long term (current) drug therapy: Secondary | ICD-10-CM | POA: Insufficient documentation

## 2016-04-15 DIAGNOSIS — R1111 Vomiting without nausea: Secondary | ICD-10-CM

## 2016-04-15 LAB — LIPASE, BLOOD: Lipase: 38 U/L (ref 11–51)

## 2016-04-15 LAB — CBC WITH DIFFERENTIAL/PLATELET
BASOS PCT: 0 %
Basophils Absolute: 0 10*3/uL (ref 0.0–0.1)
Eosinophils Absolute: 0.2 10*3/uL (ref 0.0–0.7)
Eosinophils Relative: 2 %
HCT: 42.4 % (ref 39.0–52.0)
Hemoglobin: 14.5 g/dL (ref 13.0–17.0)
Lymphocytes Relative: 18 %
Lymphs Abs: 1.8 10*3/uL (ref 0.7–4.0)
MCH: 26.9 pg (ref 26.0–34.0)
MCHC: 34.2 g/dL (ref 30.0–36.0)
MCV: 78.5 fL (ref 78.0–100.0)
MONO ABS: 0.8 10*3/uL (ref 0.1–1.0)
Monocytes Relative: 8 %
NEUTROS ABS: 7.1 10*3/uL (ref 1.7–7.7)
Neutrophils Relative %: 72 %
PLATELETS: 192 10*3/uL (ref 150–400)
RBC: 5.4 MIL/uL (ref 4.22–5.81)
RDW: 15.9 % — AB (ref 11.5–15.5)
WBC: 9.9 10*3/uL (ref 4.0–10.5)

## 2016-04-15 LAB — COMPREHENSIVE METABOLIC PANEL
ALK PHOS: 46 U/L (ref 38–126)
ALT: 63 U/L (ref 17–63)
AST: 68 U/L — ABNORMAL HIGH (ref 15–41)
Albumin: 4.4 g/dL (ref 3.5–5.0)
Anion gap: 12 (ref 5–15)
BILIRUBIN TOTAL: 0.7 mg/dL (ref 0.3–1.2)
BUN: 10 mg/dL (ref 6–20)
CALCIUM: 10 mg/dL (ref 8.9–10.3)
CO2: 23 mmol/L (ref 22–32)
CREATININE: 1.08 mg/dL (ref 0.61–1.24)
Chloride: 108 mmol/L (ref 101–111)
GFR calc non Af Amer: >60 mL/min (ref >60)
GLUCOSE: 82 mg/dL (ref 65–99)
Potassium: 4.9 mmol/L (ref 3.5–5.1)
SODIUM: 143 mmol/L (ref 135–145)
TOTAL PROTEIN: 8.9 g/dL — AB (ref 6.5–8.1)

## 2016-04-15 MED ORDER — FAMOTIDINE IN NACL 20-0.9 MG/50ML-% IV SOLN
20.0000 mg | Freq: Once | INTRAVENOUS | Status: AC
  Administered 2016-04-15: 20 mg via INTRAVENOUS
  Filled 2016-04-15: qty 50

## 2016-04-15 MED ORDER — SODIUM CHLORIDE 0.9 % IV BOLUS (SEPSIS)
1000.0000 mL | Freq: Once | INTRAVENOUS | Status: AC
  Administered 2016-04-15: 1000 mL via INTRAVENOUS

## 2016-04-15 MED ORDER — GI COCKTAIL ~~LOC~~
30.0000 mL | Freq: Once | ORAL | Status: AC
  Administered 2016-04-15: 30 mL via ORAL
  Filled 2016-04-15: qty 30

## 2016-04-15 MED ORDER — PANTOPRAZOLE SODIUM 40 MG IV SOLR
40.0000 mg | Freq: Once | INTRAVENOUS | Status: AC
Start: 2016-04-15 — End: 2016-04-15
  Administered 2016-04-15: 40 mg via INTRAVENOUS
  Filled 2016-04-15: qty 40

## 2016-04-15 MED ORDER — ONDANSETRON HCL 4 MG/2ML IJ SOLN
4.0000 mg | Freq: Once | INTRAMUSCULAR | Status: AC
  Administered 2016-04-15: 4 mg via INTRAVENOUS
  Filled 2016-04-15: qty 2

## 2016-04-15 MED ORDER — SUCRALFATE 1 GM/10ML PO SUSP: 1.0000 g | Freq: Three times a day (TID) | ORAL | 0 refills | Status: DC

## 2016-04-15 NOTE — ED Notes (Signed)
Pt had of emesis in the lobby

## 2016-04-15 NOTE — ED Notes (Signed)
Fluids not running due to patient bending his arm.

## 2016-04-15 NOTE — ED Triage Notes (Addendum)
Pt BIB EMS from [employer] parking lot; pt states he's been drinking since he woke up on Wednesday; pt reports having 4 episodes of emesis around 330am; last episode of emesis had small tinge of blood; pt has hx of GERD and states that he throws up everytime he drinks; ambulatory

## 2016-04-15 NOTE — Discharge Instructions (Signed)
All of your labs and imaging have been normal today. Please continue taking her Prilosec. Had given you Carafate take before meals. She needs to stop drinking heavily. Please follow-up with the gastrointestinal doctor that I have referred you to. Please return to the ED if you develop worsening vomiting, worsening abdominal pain.

## 2016-04-15 NOTE — ED Provider Notes (Signed)
[facility]-EMERGENCY DEPT Provider Note   CSN: [id number] Arrival date & time: 04/15/16  0446     History   Chief Complaint Chief Complaint  Patient presents with  . Alcohol Intoxication  . Emesis    HPI Kurt Ramirez is a 51 y.o. male.  HPI 51 year old American male past medical history significant for acid reflux, alcohol abuse who presents to the ED this morning after drinking alcohol at approximately 1 AM. Patient denies being an alcoholic but does state that he occasionally drinks heavily. Patient reports having 4 episodes of emesis around 3:30 AM. [facility] [address]. Patient reports the last episode of emesis had a small tinge of blood in it. States that he does have history of GERD and that he throws up every time he drinks. States he does take Prilosec or Nexium for his acid reflux. Does not see a GI doctor. Endorses epigastric tenderness. Pain is not associated with food. Patient denies any fever, chills, headache, vision changes, lightheadedness, dizziness, chest pain, shortness of breath, lower extremity edema, change in bowel habits, melena, hematochezia, paresthesias.States his last endoscopy was in 2012. Past Medical History:  Diagnosis Date  . Acid reflux     There are no active problems to display for this patient.   History reviewed. No pertinent surgical history.     Home Medications    Prior to Admission medications   Medication Sig Start Date End Date Taking? Authorizing Provider  omeprazole (PRILOSEC) 20 MG capsule Take 1 capsule (20 mg total) by mouth daily. 01/04/15   Eber Hong, MD  sucralfate (CARAFATE) 1 G tablet Take 1 tablet (1 g total) by mouth 4 (four) times daily -  with meals and at bedtime. 01/04/15   Eber Hong, MD    Family History No family history on file.  Social History Social History  Substance Use Topics  . Smoking status: Never Smoker  . Smokeless tobacco: Never Used  . Alcohol use Yes      Allergies   Patient has no known allergies.   Review of Systems Review of Systems  Constitutional: Negative for chills and fever.  HENT: Negative for congestion.   Eyes: Negative for visual disturbance.  Respiratory: Negative for cough and shortness of breath.   Cardiovascular: Negative for chest pain.  Gastrointestinal: Positive for abdominal pain (epigatric), nausea and vomiting. Negative for abdominal distention, blood in stool and diarrhea.  Genitourinary: Negative for dysuria, frequency, hematuria and urgency.  Musculoskeletal: Negative.   Skin: Negative.   Neurological: Negative for dizziness, syncope, weakness, light-headedness, numbness and headaches.  All other systems reviewed and are negative.    Physical Exam Updated Vital Signs BP 128/87 (BP Location: Right Arm)   Pulse 99   Temp 98.1 F (36.7 C) (Oral)   Resp 20   SpO2 99%   Physical Exam  Constitutional: He is oriented to person, place, and time. He appears well-developed and well-nourished. No distress.  No acute distress. Resting completely on the bed playing on cell phone. Nontoxic-appearing. No episodes of emesis while in the room.  HENT:  Head: Normocephalic and atraumatic.  Mouth/Throat: Oropharynx is clear and moist.  Mucous membranes moist.  Eyes: Conjunctivae and EOM are normal. Pupils are equal, round, and reactive to light. Right eye exhibits no discharge. Left eye exhibits no discharge. No scleral icterus.  Neck: Normal range of motion. Neck supple. No thyromegaly present.  Cardiovascular: Normal rate, regular rhythm, normal heart sounds and intact distal pulses.  Exam reveals no gallop and no friction rub.   No murmur heard. Pulmonary/Chest: Effort normal and breath sounds normal. No respiratory distress. He has no wheezes. He has no rales. He exhibits no tenderness.  Abdominal: Soft. Bowel sounds are normal. He exhibits no distension. There is tenderness in the right upper quadrant and  epigastric area. There is no rigidity, no rebound, no guarding and no CVA tenderness.  Focal tenderness in the right upper quadrant and epigastrium, no guarding or peritoneal signs, no Murphy sign, no pain at McBurney's point   Musculoskeletal: Normal range of motion.  Lymphadenopathy:    He has no cervical adenopathy.  Neurological: He is alert and oriented to person, place, and time.  Skin: Skin is warm and dry. Capillary refill takes less than 2 seconds.  Nursing note and vitals reviewed.    ED Treatments / Results  Labs (all labs ordered are listed, but only abnormal results are displayed) Labs Reviewed  COMPREHENSIVE METABOLIC PANEL - Abnormal; Notable for the following:       Result Value   Total Protein 8.9 (*)    AST 68 (*)    All other components within normal limits  CBC WITH DIFFERENTIAL/PLATELET - Abnormal; Notable for the following:    RDW 15.9 (*)    All other components within normal limits  LIPASE, BLOOD    EKG  EKG Interpretation None       Radiology [place] Abdomen Complete  Result Date: 04/15/2016 CLINICAL DATA:  Epigastric and right upper quadrant abdominal pain. EXAM: ABDOMEN ULTRASOUND COMPLETE COMPARISON:  None. FINDINGS: Gallbladder: No gallstones or wall thickening visualized. No sonographic Murphy sign noted by sonographer. Maximal wall thickness is 2.0 mm, within limits Common bile duct: Diameter: 2.2 mm, within normal limits Liver: No focal lesion identified. Within normal limits in parenchymal echogenicity. IVC: No abnormality visualized. Pancreas: Visualized portion unremarkable. Spleen: Size and appearance within normal limits. Right Kidney: Length: 10.2 cm, within normal limits. Echogenicity within normal limits. A simple cyst at the upper pole measures 2.1 x 2.4 x 1.9 cm. No other mass or hydronephrosis visualized. Left Kidney: Length: 9.9 cm, within normal limits. Echogenicity within normal limits. No mass or hydronephrosis visualized. Abdominal aorta:  No aneurysm visualized. Other findings: None. IMPRESSION: 1. No acute or focal lesion to explain epigastric or right upper quadrant abdominal pain. 2. Simple cyst at the upper pole of the right kidney. Electronically Signed   By: Marin Robertshristopher  Mattern M.D.   On: 04/15/2016 08:53    Procedures Procedures (including critical care time)  Medications Ordered in ED Medications  sodium chloride 0.9 % bolus 1,000 mL (0 mLs Intravenous Stopped 04/15/16 1202)  gi cocktail (Maalox,Lidocaine,Donnatal) (30 mLs Oral Given 04/15/16 0813)  famotidine (PEPCID) IVPB 20 mg premix (0 mg Intravenous Stopped 04/15/16 0941)  ondansetron (ZOFRAN) injection 4 mg (4 mg Intravenous Given 04/15/16 0814)  pantoprazole (PROTONIX) injection 40 mg (40 mg Intravenous Given 04/15/16 0814)     Initial Impression / Assessment and Plan / ED Course  I have reviewed the triage vital signs and the nursing notes.  Pertinent labs & imaging results that were available during my care of the patient were reviewed by me and considered in my medical decision making (see chart for details).     The patient presented to the ED with alcohol intoxication, emesis, epigastric pain. Patient has history of same as seen in the ED  for same. States every time he drinks she vomits. The patient does have a history  of acid reflux and drinking makes worse. Patient does complain of one episode of hematemesis today. Vital signs are stable. Patient is not hypotensive or tachycardic. No leukocytosis. All other labs unremarkable including lipase and liver enzymes. Electrolytes normal. Patient does have mild epigastric tenderness. Ultrasound of abdomen was performed that showed no acute abnormalities. Patient given GI cocktail, fluids, Pepcid, Protonix and Zofran the ED. No episodes of emesis in the ED. Able tolerate by mouth fluids. Patient feels much improved. Able to ambulate with normal gait without any assistance. Able to eat po food and fluids without any  emesis. Patient feels stable for discharge it is asking for paper saying the bus. Did not feel patient is clinically intoxicated at this time as he has normal gait and by mouth challenge. Encourage patient to continue taking his PPI. We'll give prescription for Carafate. Have given patient a GI referral. Hemoglobin is stable and patient does not have any further hematemesis. Did not feel any further medical management is necessary at this time. Pt is hemodynamically stable, in NAD, & able to ambulate in the ED. Pain has been managed & has no complaints prior to dc. Pt is comfortable with above plan and is stable for discharge at this time. All questions were answered prior to disposition. Strict return precautions for f/u to the ED were discussed.   Final Clinical Impressions(s) / ED Diagnoses   Final diagnoses:  Alcoholic intoxication without complication (HCC)  Epigastric abdominal pain  Vomiting without nausea, intractability of vomiting not specified, unspecified vomiting type    New Prescriptions Discharge Medication List as of 04/15/2016 12:03 PM    START taking these medications   Details  sucralfate (CARAFATE) 1 GM/10ML suspension Take 10 mLs (1 g total) by mouth 4 (four) times daily -  with meals and at bedtime., Starting Thu 04/15/2016, Print         Rise Mu, PA-C 04/16/16 4098    Arby Barrette, MD 04/19/16 [phone]

## 2016-04-15 NOTE — ED Notes (Signed)
Patient didn't wish for anything to drink or eat.  He wanted to sleep

## 2016-04-15 NOTE — ED Notes (Signed)
Patient is A & O x.4  He understood AVS forms.

## 2016-04-15 NOTE — ED Notes (Signed)
Ginger ale given to patient, he was able to tolerate fluids.

## 2016-10-04 ENCOUNTER — Emergency Department (HOSPITAL_COMMUNITY): Payer: Self-pay

## 2016-10-04 ENCOUNTER — Encounter (HOSPITAL_COMMUNITY): Payer: Self-pay | Admitting: Emergency Medicine

## 2016-10-04 ENCOUNTER — Emergency Department (HOSPITAL_COMMUNITY)
Admission: EM | Admit: 2016-10-04 | Discharge: 2016-10-04 | Disposition: A | Discharge status: Home / Self Care | Payer: Self-pay | Attending: Emergency Medicine | Admitting: Emergency Medicine

## 2016-10-04 DIAGNOSIS — Z79899 Other long term (current) drug therapy: Secondary | ICD-10-CM | POA: Insufficient documentation

## 2016-10-04 DIAGNOSIS — R0789 Other chest pain: Secondary | ICD-10-CM | POA: Insufficient documentation

## 2016-10-04 DIAGNOSIS — F17200 Nicotine dependence, unspecified, uncomplicated: Secondary | ICD-10-CM | POA: Insufficient documentation

## 2016-10-04 LAB — BASIC METABOLIC PANEL
Anion gap: 10 (ref 5–15)
BUN: 15 mg/dL (ref 6–20)
CALCIUM: 9.6 mg/dL (ref 8.9–10.3)
CHLORIDE: 104 mmol/L (ref 101–111)
CO2: 23 mmol/L (ref 22–32)
CREATININE: 1.3 mg/dL — AB (ref 0.61–1.24)
GFR calc Af Amer: >60 mL/min (ref >60)
GFR calc non Af Amer: >60 mL/min (ref >60)
GLUCOSE: 107 mg/dL — AB (ref 65–99)
Potassium: 3.8 mmol/L (ref 3.5–5.1)
Sodium: 137 mmol/L (ref 135–145)

## 2016-10-04 LAB — CBC WITH DIFFERENTIAL/PLATELET
Basophils Absolute: 0 10*3/uL (ref 0.0–0.1)
Basophils Relative: 0 %
Eosinophils Absolute: 0.7 10*3/uL (ref 0.0–0.7)
Eosinophils Relative: 6 %
HEMATOCRIT: 41.9 % (ref 39.0–52.0)
HEMOGLOBIN: 14.5 g/dL (ref 13.0–17.0)
LYMPHS ABS: 2.8 10*3/uL (ref 0.7–4.0)
Lymphocytes Relative: 24 %
MCH: 28.6 pg (ref 26.0–34.0)
MCHC: 34.6 g/dL (ref 30.0–36.0)
MCV: 82.6 fL (ref 78.0–100.0)
MONO ABS: 1.3 10*3/uL — AB (ref 0.1–1.0)
MONOS PCT: 11 %
NEUTROS ABS: 6.9 10*3/uL (ref 1.7–7.7)
NEUTROS PCT: 59 %
Platelets: 211 10*3/uL (ref 150–400)
RBC: 5.07 MIL/uL (ref 4.22–5.81)
RDW: 15.4 % (ref 11.5–15.5)
WBC: 11.7 10*3/uL — ABNORMAL HIGH (ref 4.0–10.5)

## 2016-10-04 LAB — ETHANOL: Alcohol, Ethyl (B): <5 mg/dL (ref <5)

## 2016-10-04 LAB — I-STAT TROPONIN, ED
TROPONIN I, POC: 0.01 ng/mL (ref 0.00–0.08)
TROPONIN I, POC: 0 ng/mL (ref 0.00–0.08)

## 2016-10-04 MED ORDER — GI COCKTAIL ~~LOC~~
30.0000 mL | Freq: Once | ORAL | Status: AC
  Administered 2016-10-04: 30 mL via ORAL
  Filled 2016-10-04: qty 30

## 2016-10-04 NOTE — ED Notes (Signed)
Patient transported to X-ray 

## 2016-10-04 NOTE — ED Notes (Signed)
Patient removed his monitor leads and left the ER .

## 2016-10-04 NOTE — ED Notes (Signed)
Delay in lab draw,  Pt not in room 
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
## 2016-10-04 NOTE — ED Triage Notes (Signed)
Patient arrived with EMS from a parking lot reports intermittent central chest pain radiating to lower back with mild SOB onset this evening , received 1 NTG sl by EMS rates pain 0/10 at arrival . He refused ASA due to his GERD.

## 2017-12-24 ENCOUNTER — Emergency Department (HOSPITAL_COMMUNITY): Payer: Self-pay

## 2017-12-24 ENCOUNTER — Inpatient Hospital Stay (HOSPITAL_COMMUNITY)
Admission: EM | Admit: 2017-12-24 | Discharge: 2017-12-26 | DRG: 370 | Disposition: A | Discharge status: Home / Self Care | Payer: Self-pay | Attending: Internal Medicine | Admitting: Internal Medicine

## 2017-12-24 ENCOUNTER — Encounter (HOSPITAL_COMMUNITY): Payer: Self-pay

## 2017-12-24 ENCOUNTER — Emergency Department (HOSPITAL_COMMUNITY): Payer: Self-pay | Admitting: Certified Registered Nurse Anesthetist

## 2017-12-24 ENCOUNTER — Other Ambulatory Visit: Payer: Self-pay

## 2017-12-24 ENCOUNTER — Encounter (HOSPITAL_COMMUNITY)
Admission: EM | Disposition: A | Discharge status: Home / Self Care | Payer: Self-pay | Source: Home / Self Care | Attending: Internal Medicine

## 2017-12-24 DIAGNOSIS — R1110 Vomiting, unspecified: Secondary | ICD-10-CM | POA: Diagnosis present

## 2017-12-24 DIAGNOSIS — T543X1A Toxic effect of corrosive alkalis and alkali-like substances, accidental (unintentional), initial encounter: Secondary | ICD-10-CM

## 2017-12-24 DIAGNOSIS — F1910 Other psychoactive substance abuse, uncomplicated: Secondary | ICD-10-CM

## 2017-12-24 DIAGNOSIS — J45909 Unspecified asthma, uncomplicated: Secondary | ICD-10-CM | POA: Diagnosis present

## 2017-12-24 DIAGNOSIS — X58XXXA Exposure to other specified factors, initial encounter: Secondary | ICD-10-CM | POA: Diagnosis present

## 2017-12-24 DIAGNOSIS — F1290 Cannabis use, unspecified, uncomplicated: Secondary | ICD-10-CM | POA: Diagnosis present

## 2017-12-24 DIAGNOSIS — T286XXA Corrosion of esophagus, initial encounter: Principal | ICD-10-CM

## 2017-12-24 DIAGNOSIS — R490 Dysphonia: Secondary | ICD-10-CM | POA: Diagnosis present

## 2017-12-24 DIAGNOSIS — R739 Hyperglycemia, unspecified: Secondary | ICD-10-CM | POA: Diagnosis not present

## 2017-12-24 DIAGNOSIS — Z79899 Other long term (current) drug therapy: Secondary | ICD-10-CM

## 2017-12-24 DIAGNOSIS — F17210 Nicotine dependence, cigarettes, uncomplicated: Secondary | ICD-10-CM | POA: Diagnosis present

## 2017-12-24 DIAGNOSIS — T6591XA Toxic effect of unspecified substance, accidental (unintentional), initial encounter: Secondary | ICD-10-CM | POA: Diagnosis present

## 2017-12-24 DIAGNOSIS — J4522 Mild intermittent asthma with status asthmaticus: Secondary | ICD-10-CM

## 2017-12-24 DIAGNOSIS — F1490 Cocaine use, unspecified, uncomplicated: Secondary | ICD-10-CM | POA: Diagnosis present

## 2017-12-24 DIAGNOSIS — K219 Gastro-esophageal reflux disease without esophagitis: Secondary | ICD-10-CM

## 2017-12-24 HISTORY — DX: Other psychoactive substance abuse, uncomplicated: F19.10

## 2017-12-24 HISTORY — DX: Corrosion of esophagus, initial encounter: T28.6XXA

## 2017-12-24 HISTORY — PX: ESOPHAGOGASTRODUODENOSCOPY: SHX5428

## 2017-12-24 HISTORY — DX: Toxic effect of unspecified substance, accidental (unintentional), initial encounter: T65.91XA

## 2017-12-24 LAB — COMPREHENSIVE METABOLIC PANEL
ALBUMIN: 4.2 g/dL (ref 3.5–5.0)
ALT: 46 U/L — AB (ref 0–44)
AST: 47 U/L — AB (ref 15–41)
Alkaline Phosphatase: 47 U/L (ref 38–126)
Anion gap: 12 (ref 5–15)
BILIRUBIN TOTAL: 0.8 mg/dL (ref 0.3–1.2)
BUN: 13 mg/dL (ref 6–20)
CHLORIDE: 103 mmol/L (ref 98–111)
CO2: 25 mmol/L (ref 22–32)
CREATININE: 1.27 mg/dL — AB (ref 0.61–1.24)
Calcium: 9.7 mg/dL (ref 8.9–10.3)
GFR calc Af Amer: >60 mL/min (ref >60)
Glucose, Bld: 102 mg/dL — ABNORMAL HIGH (ref 70–99)
POTASSIUM: 4.1 mmol/L (ref 3.5–5.1)
SODIUM: 140 mmol/L (ref 135–145)
Total Protein: 9 g/dL — ABNORMAL HIGH (ref 6.5–8.1)

## 2017-12-24 LAB — RAPID URINE DRUG SCREEN, HOSP PERFORMED
AMPHETAMINES: NOT DETECTED
BARBITURATES: NOT DETECTED
Benzodiazepines: NOT DETECTED
COCAINE: POSITIVE — AB
OPIATES: NOT DETECTED
Tetrahydrocannabinol: NOT DETECTED

## 2017-12-24 LAB — CBC WITH DIFFERENTIAL/PLATELET
Abs Immature Granulocytes: 0.02 10*3/uL (ref 0.00–0.07)
BASOS ABS: 0 10*3/uL (ref 0.0–0.1)
Basophils Relative: 0 %
EOS ABS: 0.5 10*3/uL (ref 0.0–0.5)
EOS PCT: 5 %
HCT: 50.3 % (ref 39.0–52.0)
HEMOGLOBIN: 16.1 g/dL (ref 13.0–17.0)
Immature Granulocytes: 0 %
LYMPHS PCT: 16 %
Lymphs Abs: 1.5 10*3/uL (ref 0.7–4.0)
MCH: 27.9 pg (ref 26.0–34.0)
MCHC: 32 g/dL (ref 30.0–36.0)
MCV: 87 fL (ref 80.0–100.0)
Monocytes Absolute: 0.7 10*3/uL (ref 0.1–1.0)
Monocytes Relative: 7 %
NRBC: 0 % (ref 0.0–0.2)
Neutro Abs: 6.4 10*3/uL (ref 1.7–7.7)
Neutrophils Relative %: 72 %
Platelets: 157 10*3/uL (ref 150–400)
RBC: 5.78 MIL/uL (ref 4.22–5.81)
RDW: 13.9 % (ref 11.5–15.5)
WBC: 9 10*3/uL (ref 4.0–10.5)

## 2017-12-24 LAB — ETHANOL: Alcohol, Ethyl (B): <10 mg/dL (ref <10)

## 2017-12-24 LAB — ACETAMINOPHEN LEVEL

## 2017-12-24 LAB — SALICYLATE LEVEL: Salicylate Lvl: <7 mg/dL (ref 2.8–30.0)

## 2017-12-24 SURGERY — EGD (ESOPHAGOGASTRODUODENOSCOPY)
Anesthesia: General

## 2017-12-24 MED ORDER — FENTANYL CITRATE (PF) 250 MCG/5ML IJ SOLN
INTRAMUSCULAR | Status: DC | PRN
  Administered 2017-12-24: 100 ug via INTRAVENOUS

## 2017-12-24 MED ORDER — LIDOCAINE 2% (20 MG/ML) 5 ML SYRINGE
INTRAMUSCULAR | Status: DC | PRN
  Administered 2017-12-24: 80 mg via INTRAVENOUS

## 2017-12-24 MED ORDER — PROMETHAZINE HCL 25 MG/ML IJ SOLN: 6.2500 mg | INTRAMUSCULAR | Status: DC | PRN

## 2017-12-24 MED ORDER — ONDANSETRON HCL 4 MG/2ML IJ SOLN
INTRAMUSCULAR | Status: DC | PRN
  Administered 2017-12-24: 4 mg via INTRAVENOUS

## 2017-12-24 MED ORDER — PROPOFOL 10 MG/ML IV BOLUS
INTRAVENOUS | Status: DC | PRN
  Administered 2017-12-24: 170 mg via INTRAVENOUS

## 2017-12-24 MED ORDER — ONDANSETRON HCL 4 MG/2ML IJ SOLN
4.0000 mg | Freq: Once | INTRAMUSCULAR | Status: AC
  Administered 2017-12-24: 4 mg via INTRAVENOUS
  Filled 2017-12-24: qty 2

## 2017-12-24 MED ORDER — POLYETHYLENE GLYCOL 3350 17 G PO PACK: 17.0000 g | PACK | Freq: Every day | ORAL | Status: DC | PRN

## 2017-12-24 MED ORDER — LACTATED RINGERS IV SOLN
INTRAVENOUS | Status: DC | PRN
  Administered 2017-12-24: 18:00:00 via INTRAVENOUS

## 2017-12-24 MED ORDER — ACETAMINOPHEN 650 MG RE SUPP: 650.0000 mg | Freq: Four times a day (QID) | RECTAL | Status: DC | PRN

## 2017-12-24 MED ORDER — SODIUM CHLORIDE 0.9 % IV BOLUS
1000.0000 mL | Freq: Once | INTRAVENOUS | Status: AC
  Administered 2017-12-24: 1000 mL via INTRAVENOUS

## 2017-12-24 MED ORDER — DEXAMETHASONE SODIUM PHOSPHATE 10 MG/ML IJ SOLN
INTRAMUSCULAR | Status: DC | PRN
  Administered 2017-12-24: 10 mg via INTRAVENOUS

## 2017-12-24 MED ORDER — IOHEXOL 300 MG/ML  SOLN
75.0000 mL | Freq: Once | INTRAMUSCULAR | Status: AC | PRN
  Administered 2017-12-24: 75 mL via INTRAVENOUS

## 2017-12-24 MED ORDER — ONDANSETRON HCL 4 MG PO TABS: 4.0000 mg | ORAL_TABLET | Freq: Four times a day (QID) | ORAL | Status: DC | PRN

## 2017-12-24 MED ORDER — ACETAMINOPHEN 325 MG PO TABS: 650.0000 mg | ORAL_TABLET | Freq: Four times a day (QID) | ORAL | Status: DC | PRN

## 2017-12-24 MED ORDER — SODIUM CHLORIDE 0.9 % IV SOLN
80.0000 mg | Freq: Once | INTRAVENOUS | Status: AC
  Administered 2017-12-24: 80 mg via INTRAVENOUS
  Filled 2017-12-24: qty 80

## 2017-12-24 MED ORDER — SODIUM CHLORIDE 0.9 % IV SOLN
INTRAVENOUS | Status: DC
  Administered 2017-12-24 – 2017-12-26 (×3): via INTRAVENOUS

## 2017-12-24 MED ORDER — FENTANYL CITRATE (PF) 100 MCG/2ML IJ SOLN: 25.0000 ug | INTRAMUSCULAR | Status: DC | PRN

## 2017-12-24 MED ORDER — MORPHINE SULFATE (PF) 2 MG/ML IV SOLN: 2.0000 mg | INTRAVENOUS | Status: DC | PRN

## 2017-12-24 MED ORDER — MIDAZOLAM HCL 2 MG/2ML IJ SOLN
INTRAMUSCULAR | Status: DC | PRN
  Administered 2017-12-24: 1 mg via INTRAVENOUS

## 2017-12-24 MED ORDER — SODIUM CHLORIDE 0.9 % IV SOLN
8.0000 mg/h | INTRAVENOUS | Status: DC
  Administered 2017-12-24 – 2017-12-26 (×2): 8 mg/h via INTRAVENOUS
  Filled 2017-12-24 (×6): qty 80

## 2017-12-24 MED ORDER — SUCCINYLCHOLINE CHLORIDE 200 MG/10ML IV SOSY
PREFILLED_SYRINGE | INTRAVENOUS | Status: DC | PRN
  Administered 2017-12-24: 100 mg via INTRAVENOUS

## 2017-12-24 MED ORDER — OXYCODONE HCL 5 MG PO TABS: 5.0000 mg | ORAL_TABLET | ORAL | Status: DC | PRN

## 2017-12-24 MED ORDER — ONDANSETRON HCL 4 MG/2ML IJ SOLN: 4.0000 mg | Freq: Four times a day (QID) | INTRAMUSCULAR | Status: DC | PRN
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
## 2017-12-24 NOTE — Transfer of Care (Signed)
Immediate Anesthesia Transfer of Care Note  Patient: Kurt Ramirez  Procedure(s) Performed: ESOPHAGOGASTRODUODENOSCOPY (EGD) (N/A )  Patient Location: PACU  Anesthesia Type:General  Level of Consciousness: awake  Airway & Oxygen Therapy: Patient Spontanous Breathing and Patient connected to nasal cannula oxygen  Post-op Assessment: Report given to RN and Post -op Vital signs reviewed and stable  Post vital signs: Reviewed and stable  Last Vitals:  Vitals Value Taken Time  BP    Temp    Pulse    Resp    SpO2      Last Pain:  Vitals:   12/24/17 1740  TempSrc: Axillary  PainSc:          Complications: No apparent anesthesia complications
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
## 2017-12-24 NOTE — Anesthesia Preprocedure Evaluation (Addendum)
Anesthesia Evaluation  Patient identified by MRN, date of birth, ID band Patient awake    Reviewed: Allergy & Precautions, NPO status , Patient's Chart, lab work & pertinent test results  Airway Mallampati: II  TM Distance: >3 FB Neck ROM: Full    Dental  (+) Dental Advisory Given, Missing   Pulmonary Current Smoker,    Pulmonary exam normal breath sounds clear to auscultation       Cardiovascular negative cardio ROS Normal cardiovascular exam Rhythm:Regular Rate:Normal     Neuro/Psych negative neurological ROS     GI/Hepatic GERD  Medicated,(+)     substance abuse  cocaine use, caustic ingestion   Endo/Other  negative endocrine ROS  Renal/GU negative Renal ROS     Musculoskeletal negative musculoskeletal ROS (+)   Abdominal   Peds  Hematology negative hematology ROS (+)   Anesthesia Other Findings Day of surgery medications reviewed with the patient.  Reproductive/Obstetrics                            Anesthesia Physical Anesthesia Plan  ASA: II and emergent  Anesthesia Plan: General   Post-op Pain Management:    Induction: Intravenous, Rapid sequence and Cricoid pressure planned  PONV Risk Score and Plan: 1 and Treatment may vary due to age or medical condition, Ondansetron and Dexamethasone  Airway Management Planned: Oral ETT  Additional Equipment:   Intra-op Plan:   Post-operative Plan: Extubation in OR  Informed Consent: I have reviewed the patients History and Physical, chart, labs and discussed the procedure including the risks, benefits and alternatives for the proposed anesthesia with the patient or authorized representative who has indicated his/her understanding and acceptance.   Dental advisory given  Plan Discussed with: CRNA and Anesthesiologist  Anesthesia Plan Comments: (Cocaine use this AM. Ingested unknown liquid at 1300.  Last food at 1000.  Emergent  case.)       Anesthesia Quick Evaluation

## 2017-12-24 NOTE — Op Note (Signed)
[facility] Name: Kurt DubinJesse Ramirez Procedure Date : 12/24/2017 MRN: [id number] Attending MD: Bernette Redbirdobert Stella Bortle , MD Date of Birth: 1965/09/07 CSN: [id number] Age: [age] Admit Type: Inpatient Procedure:                Upper GI endoscopy Indications:              Endoscopy to assess acute gastrointestinal injury                            after caustic ingestion Providers:                Bernette Redbirdobert Ruari Mudgett, MD, Bonney LeitzShanice Khonje, Lawson Radararlene Davis,                            Technician, Dairl PonderFudan Jiang, CRNA Referring MD:              Medicines:                General Anesthesia Complications:            No immediate complications. Estimated Blood Loss:     Estimated blood loss: none. Procedure:                After obtaining informed consent, the endoscope was                            passed under direct vision. Throughout the                            procedure, the patient's blood pressure, pulse, and                            oxygen saturations were monitored continuously. The                            [id number]-[id number] ([id number] Olympus Adult EGD was introduced                            through the mouth, and advanced to the second part                            of duodenum. The upper GI endoscopy was                            accomplished without difficulty. The patient                            tolerated the procedure well. Scope In: Scope Out: Findings:      Diffuse mild mucosal changes characterized by longitudinal markings and       relatively pale mucosa were found in the entire esophagus. This could be       a normal variant, but I wonder if there has been some superficial injury       to account for this finding.      There is no endoscopic evidence of areas of erosion, hiatal hernia,       inflammation, ulcerations or erythema in the entire esophagus.  Clear fluid (small amount) was found in the gastric fundus. Fluid       aspiration was performed.      Patchy  moderately erythematous mucosa with superficial ulceration and       exudate, with some intramucosal hemorrhage but without free bleeding was       found especially in the gastric fundus and to a lesser degree in the       gastric body and antrum.      There is no endoscopic evidence of deep ulcerations or necrosis in the       entire examined stomach.      The examined duodenum was normal. Impression:               - Possible mucosal changes in the esophagus.                           - Clear gastric fluid. Fluid aspiration performed.                           - Erythematous, superficially ulcerated mucosa in                            the gastric fundus and gastric body, consistent                            with mild caustic injury (consistent with Zargar                            endoscopic classification 2A).                           - Normal examined duodenum. Recommendation:           - Observe patient's clinical course.                           - Use Protonix (pantoprazole) 80 mg IV daily for 2                            days.                           - Clear liquid diet beginning tomorrow for 2 days,                            advanced to regular thereafter if tolerated. Procedure Code(s):        --- Professional ---                           [phone], Esophagogastroduodenoscopy, flexible,                            transoral; diagnostic, including collection of                            specimen(s) by brushing or washing, when performed                            (  separate procedure) Diagnosis Code(s):        --- Professional ---                           K22.8, Other specified diseases of esophagus                           K31.89, Other diseases of stomach and duodenum                           T54.94XA, Toxic effect of unspecified corrosive                            substance, undetermined, initial encounter CPT copyright 2018 [employer]. All rights  reserved. The codes documented in this report are preliminary and upon coder review may  be revised to meet current compliance requirements. Bernette Redbird, MD 12/24/2017 6:26:22 PM This report has been signed electronically. Number of Addenda: 0

## 2017-12-24 NOTE — Anesthesia Postprocedure Evaluation (Signed)
Anesthesia Post Note  Patient: Kurt Ramirez  Procedure(s) Performed: ESOPHAGOGASTRODUODENOSCOPY (EGD) (N/A )     Patient location during evaluation: Endoscopy Anesthesia Type: General Level of consciousness: awake and alert Pain management: pain level controlled Vital Signs Assessment: post-procedure vital signs reviewed and stable Respiratory status: spontaneous breathing, nonlabored ventilation and respiratory function stable Cardiovascular status: blood pressure returned to baseline and stable Postop Assessment: no apparent nausea or vomiting Anesthetic complications: no    Last Vitals:  Vitals:   12/24/17 1930 12/24/17 1935  BP: 118/73   Pulse: 89 86  Resp: 16 16  Temp:  36.5 C  SpO2: 96% 97%    Last Pain:  Vitals:   12/24/17 1935  TempSrc:   PainSc: Asleep                 Cecile HearingStephen Edward Mckenzie Toruno

## 2017-12-24 NOTE — Anesthesia Procedure Notes (Signed)
Procedure Name: Intubation Date/Time: 12/24/2017 5:51 PM Performed by: Clearnce Sorrel, CRNA Pre-anesthesia Checklist: Patient identified, Emergency Drugs available, Suction available, Patient being monitored and Timeout performed Patient Re-evaluated:Patient Re-evaluated prior to induction Oxygen Delivery Method: Circle system utilized Preoxygenation: Pre-oxygenation with 100% oxygen Induction Type: IV induction, Cricoid Pressure applied and Rapid sequence Laryngoscope Size: Mac and 4 Grade View: Grade I Tube type: Oral Tube size: 7.5 mm Number of attempts: 1 Placement Confirmation: ETT inserted through vocal cords under direct vision,  positive ETCO2 and breath sounds checked- equal and bilateral Secured at: 23 cm Tube secured with: Tape Dental Injury: Teeth and Oropharynx as per pre-operative assessment

## 2017-12-24 NOTE — ED Notes (Signed)
Got patient on the montior patient is resting with call bell in reach

## 2017-12-24 NOTE — Progress Notes (Signed)
Patient's endoscopy mild to moderate gastric mucosal injury consistent with caustic ingestion.  (Zargar Classification 2A).  (Please see procedure report.)    This has a relatively good prognosis.    The patient can probably start clear liquids tomorrow (ordered).  Agree with IV PPI.  No clear role for sucralfate.  Please call me if you have questions or want to discuss the patient's case.  Florencia Reasonsobert V. Cairo Lingenfelter, M.D. Pager [phone] If no answer or after 5 PM call [phone]
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
## 2017-12-24 NOTE — Progress Notes (Signed)
Pt placed on hold - awaiting shift change - pt resting -report called by endo nurse -Lanier Prudesharice, RN

## 2017-12-25 ENCOUNTER — Encounter (HOSPITAL_COMMUNITY): Payer: Self-pay | Admitting: Internal Medicine

## 2017-12-25 DIAGNOSIS — K219 Gastro-esophageal reflux disease without esophagitis: Secondary | ICD-10-CM | POA: Diagnosis present

## 2017-12-25 DIAGNOSIS — R739 Hyperglycemia, unspecified: Secondary | ICD-10-CM

## 2017-12-25 DIAGNOSIS — T6591XA Toxic effect of unspecified substance, accidental (unintentional), initial encounter: Secondary | ICD-10-CM | POA: Diagnosis present

## 2017-12-25 DIAGNOSIS — F1910 Other psychoactive substance abuse, uncomplicated: Secondary | ICD-10-CM | POA: Diagnosis present

## 2017-12-25 LAB — CBC
HCT: 45.4 % (ref 39.0–52.0)
Hemoglobin: 14.6 g/dL (ref 13.0–17.0)
MCH: 27.6 pg (ref 26.0–34.0)
MCHC: 32.2 g/dL (ref 30.0–36.0)
MCV: 85.8 fL (ref 80.0–100.0)
Platelets: 188 10*3/uL (ref 150–400)
RBC: 5.29 MIL/uL (ref 4.22–5.81)
RDW: 13.9 % (ref 11.5–15.5)
WBC: 11.9 10*3/uL — AB (ref 4.0–10.5)
nRBC: 0 % (ref 0.0–0.2)

## 2017-12-25 LAB — COMPREHENSIVE METABOLIC PANEL
ALT: 33 U/L (ref 0–44)
AST: 28 U/L (ref 15–41)
Albumin: 3.3 g/dL — ABNORMAL LOW (ref 3.5–5.0)
Alkaline Phosphatase: 41 U/L (ref 38–126)
Anion gap: 8 (ref 5–15)
BILIRUBIN TOTAL: 0.9 mg/dL (ref 0.3–1.2)
BUN: 10 mg/dL (ref 6–20)
CO2: 22 mmol/L (ref 22–32)
CREATININE: 1.02 mg/dL (ref 0.61–1.24)
Calcium: 9.1 mg/dL (ref 8.9–10.3)
Chloride: 105 mmol/L (ref 98–111)
GFR calc Af Amer: >60 mL/min (ref >60)
GFR calc non Af Amer: >60 mL/min (ref >60)
Glucose, Bld: 180 mg/dL — ABNORMAL HIGH (ref 70–99)
Potassium: 4.4 mmol/L (ref 3.5–5.1)
Sodium: 135 mmol/L (ref 135–145)
TOTAL PROTEIN: 7.2 g/dL (ref 6.5–8.1)

## 2017-12-25 LAB — HIV ANTIBODY (ROUTINE TESTING W REFLEX): HIV SCREEN 4TH GENERATION: NONREACTIVE

## 2017-12-25 MED ORDER — LORAZEPAM 1 MG PO TABS: 1.0000 mg | ORAL_TABLET | Freq: Four times a day (QID) | ORAL | Status: DC | PRN

## 2017-12-25 MED ORDER — LORAZEPAM 2 MG/ML IJ SOLN: 1.0000 mg | Freq: Four times a day (QID) | INTRAMUSCULAR | Status: DC | PRN

## 2017-12-25 MED ORDER — PHENOL 1.4 % MT LIQD
1.0000 | OROMUCOSAL | Status: DC | PRN
  Administered 2017-12-25: 1 via OROMUCOSAL
  Filled 2017-12-25: qty 177

## 2017-12-25 NOTE — Progress Notes (Signed)
Patient has no complaint of abdominal pain and has been tolerating liquids without discomfort or nausea.  He feels hungry and would like to have his diet advanced.  On exam, he is lying in bed in absolutely no distress, good spirits.  The abdomen is soft and nontender to deep palpation.  Impression: Stable following caustic ingestion.  Good prognosis based on endoscopic findings.  Recommendations:  1.  Endoscopic findings reviewed with patient and copy of his procedure note was provided to him 2.  I have advanced the patient to a soft diet.  Once he goes home, he may have an unrestricted diet. 3.  In my opinion, as long as he tolerates solid food this evening, he would be okay for discharge tomorrow. 4.  No endoscopic or GI office follow-up is needed. 5.  I will sign off.  However, please feel free to call us if you have questions or we can be of further assistance in this patient's care.  Florencia Reasonsobert V. September Mormile, M.D. Pager [phone] If no answer or after 5 PM call [phone]
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
## 2017-12-26 LAB — CBC
HEMATOCRIT: 38.7 % — AB (ref 39.0–52.0)
Hemoglobin: 12.5 g/dL — ABNORMAL LOW (ref 13.0–17.0)
MCH: 28.1 pg (ref 26.0–34.0)
MCHC: 32.3 g/dL (ref 30.0–36.0)
MCV: 87 fL (ref 80.0–100.0)
Platelets: 150 10*3/uL (ref 150–400)
RBC: 4.45 MIL/uL (ref 4.22–5.81)
RDW: 14 % (ref 11.5–15.5)
WBC: 11.4 10*3/uL — AB (ref 4.0–10.5)
nRBC: 0 % (ref 0.0–0.2)

## 2017-12-26 LAB — BASIC METABOLIC PANEL
Anion gap: 5 (ref 5–15)
BUN: 10 mg/dL (ref 6–20)
CALCIUM: 8.3 mg/dL — AB (ref 8.9–10.3)
CO2: 23 mmol/L (ref 22–32)
CREATININE: 1.05 mg/dL (ref 0.61–1.24)
Chloride: 111 mmol/L (ref 98–111)
GFR calc non Af Amer: >60 mL/min (ref >60)
Glucose, Bld: 133 mg/dL — ABNORMAL HIGH (ref 70–99)
Potassium: 3.8 mmol/L (ref 3.5–5.1)
SODIUM: 139 mmol/L (ref 135–145)

## 2017-12-26 LAB — HEMOGLOBIN A1C
HEMOGLOBIN A1C: 5.6 % (ref 4.8–5.6)
Mean Plasma Glucose: 114.02 mg/dL

## 2017-12-26 MED ORDER — OMEPRAZOLE 20 MG PO CPDR: 20.0000 mg | DELAYED_RELEASE_CAPSULE | Freq: Every day | ORAL | 0 refills | Status: AC
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
## 2017-12-26 NOTE — Care Management Note (Signed)
Case Management Note  Patient Details  Name: Kurt Ramirez MRN: [id number] Date of Birth: Aug 12, 1965  Subjective/Objective:                    Action/Plan:  [facility] 1050 am patient has information and voiced understanding.  Only prescription is over the counter [facility] does not cover over the counter medications. Expected Discharge Date:  12/26/17               Expected Discharge Plan:  Home/Self Care  In-House Referral:  Financial Counselor  Discharge planning Services  CM Consult, Indigent [facility]  Post Acute Care Choice:  NA Choice offered to:  Patient  DME Arranged:  N/A DME Agency:  NA  HH Arranged:  NA HH Agency:  NA  Status of Service:  Completed, signed off  If discussed at Long Length of Stay Meetings, dates discussed:    Additional Comments:  Kingsley PlanWile, Karliah Kowalchuk Marie, RN 12/26/2017, 10:38 AM

## 2017-12-26 NOTE — Progress Notes (Signed)
Dorna MaiJesse R Rovner to be D/C'd  per MD order. Discussed with the patient and all questions fully answered.  VSS, Skin clean, dry and intact without evidence of skin break down, no evidence of skin tears noted.  IV catheter discontinued intact. Site without signs and symptoms of complications. Dressing and pressure applied.  An After Visit Summary was printed and given to the patient. Patient received prescription.  D/c education completed with patient/family including follow up instructions, medication list, d/c activities limitations if indicated, with other d/c instructions as indicated by MD - patient able to verbalize understanding, all questions fully answered.   Patient instructed to return to ED, call 911, or call MD for any changes in condition.   Patient walked out per pt. Request to do so.  Marykathleen Russi L. Lorella NimrodHarvey

## 2017-12-27 ENCOUNTER — Encounter (HOSPITAL_COMMUNITY): Payer: Self-pay | Admitting: Gastroenterology
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
## 2018-01-04 ENCOUNTER — Inpatient Hospital Stay: Payer: Self-pay

## 2018-07-31 ENCOUNTER — Emergency Department (HOSPITAL_COMMUNITY): Payer: Self-pay

## 2018-07-31 ENCOUNTER — Other Ambulatory Visit: Payer: Self-pay

## 2018-07-31 ENCOUNTER — Emergency Department (HOSPITAL_COMMUNITY)
Admission: EM | Admit: 2018-07-31 | Discharge: 2018-07-31 | Disposition: A | Discharge status: Home / Self Care | Payer: Self-pay | Attending: Emergency Medicine | Admitting: Emergency Medicine

## 2018-07-31 ENCOUNTER — Encounter (HOSPITAL_COMMUNITY): Payer: Self-pay | Admitting: *Deleted

## 2018-07-31 DIAGNOSIS — Y998 Other external cause status: Secondary | ICD-10-CM | POA: Insufficient documentation

## 2018-07-31 DIAGNOSIS — S6991XA Unspecified injury of right wrist, hand and finger(s), initial encounter: Secondary | ICD-10-CM | POA: Insufficient documentation

## 2018-07-31 DIAGNOSIS — Y9389 Activity, other specified: Secondary | ICD-10-CM | POA: Insufficient documentation

## 2018-07-31 DIAGNOSIS — F17200 Nicotine dependence, unspecified, uncomplicated: Secondary | ICD-10-CM | POA: Insufficient documentation

## 2018-07-31 DIAGNOSIS — Y929 Unspecified place or not applicable: Secondary | ICD-10-CM | POA: Insufficient documentation

## 2018-07-31 MED ORDER — NAPROXEN 500 MG PO TABS: 500.0000 mg | ORAL_TABLET | Freq: Two times a day (BID) | ORAL | 0 refills | Status: DC
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
## 2018-07-31 NOTE — ED Notes (Signed)
Patient verbalizes understanding of discharge instructions. Opportunity for questioning and answers were provided. Armband removed by staff, pt discharged from ED.  

## 2018-07-31 NOTE — Discharge Instructions (Signed)
Please read and follow all provided instructions.  You have been seen today for a right hand injury. Your x-ray did not show a fracture/dislocation. Given your physical exam we would like you to wear the brace provided in case of an occult fracture (one that is difficult to see on the initial x-ray). Please wear this until you have followed up with hand surgery and detailed below.   Tests performed today include: An x-ray of the affected area - does NOT show any broken bones or dislocations.  Vital signs. See below for your results today.   Home care instructions: -- *PRICE in the first 24-48 hours after injury: Protect (with brace, splint, sling), if given by your provider Rest Ice- Do not apply ice pack directly to your skin, place towel or similar between your skin and ice/ice pack. Apply ice for 20 min, then remove for 40 min while awake Compression- Wear brace, elastic bandage, splint as directed by your provider Elevate affected extremity above the level of your heart when not walking around for the first 24-48 hours   Medications:  - Naproxen is a nonsteroidal anti-inflammatory medication that will help with pain and swelling. Be sure to take this medication as prescribed with food, 1 pill every 12 hours,  It should be taken with food, as it can cause stomach upset, and more seriously, stomach bleeding. Do not take other nonsteroidal anti-inflammatory medications with this such as Advil, Motrin, Aleve, Mobic, Goodie Powder, or Motrin.    You make take Tylenol per over the counter dosing with these medications.   We have prescribed you new medication(s) today. Discuss the medications prescribed today with your pharmacist as they can have adverse effects and interactions with your other medicines including over the counter and prescribed medications. Seek medical evaluation if you start to experience new or abnormal symptoms after taking one of these medicines, seek care immediately if you  start to experience difficulty breathing, feeling of your throat closing, facial swelling, or rash as these could be indications of a more serious allergic reaction   Follow-up instructions: Please follow-up with hand surgery within 1 week. Please remain in the brace. In this case you may have a more severe injury that requires further care.   Return instructions:  Please return if your digits or extremity are numb or tingling, appear gray or blue, or you have severe pain (also elevate the extremity and loosen splint or wrap if you were given one) Please return if you have redness or fevers.  Please return to the Emergency Department if you experience worsening symptoms.  Please return if you have any other emergent concerns. Additional Information:  Your vital signs today were: BP 118/78 (BP Location: Left Arm)    Pulse 92    Temp 98.4 F (36.9 C) (Oral)    Resp 14    Ht 5\' 3"  (1.6 m)    Wt 56.7 kg    SpO2 97%    BMI 22.14 kg/m  If your blood pressure (BP) was elevated above 135/85 this visit, please have this repeated by your doctor within one month. ---------------

## 2018-11-17 ENCOUNTER — Encounter (HOSPITAL_COMMUNITY): Payer: Self-pay | Admitting: Emergency Medicine

## 2018-11-17 ENCOUNTER — Other Ambulatory Visit: Payer: Self-pay

## 2018-11-17 ENCOUNTER — Emergency Department (HOSPITAL_COMMUNITY)
Admission: EM | Admit: 2018-11-17 | Discharge: 2018-11-17 | Disposition: A | Discharge status: Home / Self Care | Payer: Self-pay | Attending: Emergency Medicine | Admitting: Emergency Medicine

## 2018-11-17 DIAGNOSIS — G8929 Other chronic pain: Secondary | ICD-10-CM | POA: Insufficient documentation

## 2018-11-17 DIAGNOSIS — F17210 Nicotine dependence, cigarettes, uncomplicated: Secondary | ICD-10-CM | POA: Insufficient documentation

## 2018-11-17 DIAGNOSIS — M25561 Pain in right knee: Secondary | ICD-10-CM | POA: Insufficient documentation

## 2018-11-17 DIAGNOSIS — Z79899 Other long term (current) drug therapy: Secondary | ICD-10-CM | POA: Insufficient documentation

## 2018-11-17 DIAGNOSIS — M5440 Lumbago with sciatica, unspecified side: Secondary | ICD-10-CM | POA: Insufficient documentation

## 2018-11-17 DIAGNOSIS — M25562 Pain in left knee: Secondary | ICD-10-CM | POA: Insufficient documentation

## 2018-11-17 MED ORDER — NAPROXEN 500 MG PO TABS: 500.0000 mg | ORAL_TABLET | Freq: Two times a day (BID) | ORAL | 0 refills | Status: AC

## 2018-11-17 NOTE — Discharge Instructions (Addendum)
The number to the [facility] health and wellness clinic has been attached to your chart, please schedule an appointment to establish primary care.  A short prescription for anti-inflammatory has been given, please take 1 tablet twice a day for the next few days.  May apply ice or heat to your back to help with your symptoms.
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
## 2018-11-17 NOTE — ED Triage Notes (Signed)
Pt reports chronic lower back and bilateral knee pain for the past few years, states he played sports when he was younger, no new trauma. Ambulatory without issue.

## 2018-12-13 ENCOUNTER — Encounter: Payer: Self-pay | Admitting: Family Medicine

## 2018-12-13 ENCOUNTER — Other Ambulatory Visit: Payer: Self-pay

## 2018-12-13 ENCOUNTER — Ambulatory Visit: Payer: Self-pay | Attending: Family Medicine | Admitting: Family Medicine

## 2018-12-13 VITALS — BP 140/87 | HR 113 | Temp 98.5°F | Ht 63.0 in | Wt 130.0 lb

## 2018-12-13 DIAGNOSIS — M549 Dorsalgia, unspecified: Secondary | ICD-10-CM

## 2018-12-13 DIAGNOSIS — R000 Tachycardia, unspecified: Secondary | ICD-10-CM

## 2018-12-13 MED ORDER — CYCLOBENZAPRINE HCL 10 MG PO TABS: 10.0000 mg | ORAL_TABLET | Freq: Two times a day (BID) | ORAL | 1 refills | Status: AC | PRN

## 2018-12-13 MED ORDER — MELOXICAM 7.5 MG PO TABS: 7.5000 mg | ORAL_TABLET | Freq: Every day | ORAL | 1 refills | Status: AC

## 2018-12-13 MED FILL — MELOXICAM 7.5 MG TABLET: 7.5 | 30 days supply | Qty: 30 | Fill #0

## 2018-12-13 MED FILL — CYCLOBENZAPRINE 10 MG TAB: 10 | 30 days supply | Qty: 60 | Fill #0

## 2018-12-13 NOTE — Patient Instructions (Signed)

## 2018-12-14 LAB — BASIC METABOLIC PANEL
BUN/Creatinine Ratio: 7 — ABNORMAL LOW (ref 9–20)
BUN: 8 mg/dL (ref 6–24)
CO2: 23 mmol/L (ref 20–29)
Calcium: 9.9 mg/dL (ref 8.7–10.2)
Chloride: 107 mmol/L — ABNORMAL HIGH (ref 96–106)
Creatinine, Ser: 1.15 mg/dL (ref 0.76–1.27)
GFR calc Af Amer: 85 mL/min/{1.73_m2} (ref >59)
GFR calc non Af Amer: 74 mL/min/{1.73_m2} (ref >59)
Glucose: 78 mg/dL (ref 65–99)
Potassium: 4.3 mmol/L (ref 3.5–5.2)
Sodium: 145 mmol/L — ABNORMAL HIGH (ref 134–144)

## 2018-12-14 LAB — TSH: TSH: 1.6 u[IU]/mL (ref 0.450–4.500)

## 2018-12-14 LAB — T4, FREE: Free T4: 0.95 ng/dL (ref 0.82–1.77)

## 2018-12-15 ENCOUNTER — Telehealth: Payer: Self-pay
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
## 2018-12-15 NOTE — Telephone Encounter (Signed)
-----   Message from Charlott Rakes, MD sent at 12/14/2018  9:29 AM EST ----- Labs are stable

## 2018-12-15 NOTE — Telephone Encounter (Signed)
Patient name and DOB has been verified Patient was informed of lab results. Patient had no questions.  

## 2019-07-13 ENCOUNTER — Encounter (HOSPITAL_COMMUNITY): Payer: Self-pay | Admitting: Emergency Medicine

## 2019-07-13 ENCOUNTER — Emergency Department (HOSPITAL_COMMUNITY)
Admission: EM | Admit: 2019-07-13 | Discharge: 2019-07-13 | Disposition: A | Discharge status: Home / Self Care | Payer: Self-pay | Attending: Emergency Medicine | Admitting: Emergency Medicine

## 2019-07-13 ENCOUNTER — Other Ambulatory Visit: Payer: Self-pay

## 2019-07-13 DIAGNOSIS — Z79899 Other long term (current) drug therapy: Secondary | ICD-10-CM | POA: Insufficient documentation

## 2019-07-13 DIAGNOSIS — F17200 Nicotine dependence, unspecified, uncomplicated: Secondary | ICD-10-CM | POA: Insufficient documentation

## 2019-07-13 DIAGNOSIS — H1032 Unspecified acute conjunctivitis, left eye: Secondary | ICD-10-CM | POA: Insufficient documentation

## 2019-07-13 MED ORDER — ERYTHROMYCIN 5 MG/GM OP OINT: TOPICAL_OINTMENT | OPHTHALMIC | 0 refills | Status: AC

## 2019-07-13 MED ORDER — TETRACAINE HCL 0.5 % OP SOLN
2.0000 [drp] | Freq: Once | OPHTHALMIC | Status: AC
  Administered 2019-07-13: 2 [drp] via OPHTHALMIC
  Filled 2019-07-13: qty 4

## 2019-07-13 MED ORDER — FLUORESCEIN SODIUM 1 MG OP STRP
1.0000 | ORAL_STRIP | Freq: Once | OPHTHALMIC | Status: AC
  Administered 2019-07-13: 1 via OPHTHALMIC
  Filled 2019-07-13: qty 1

## 2019-07-13 NOTE — ED Notes (Signed)
Pt called three times to room. Did not answer.
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
## 2019-07-13 NOTE — ED Notes (Signed)
Called to bring pt to room from lobby. Called 2X with no response, Also checked triage. No pt to be found. RN notified
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
## 2019-07-13 NOTE — ED Notes (Signed)
Visual acuity completed. Both eyes 10/12.5.

## 2019-07-13 NOTE — ED Notes (Signed)
Woods lamp at bedside as well as meds ordered.

## 2019-07-13 NOTE — Discharge Instructions (Signed)
Per discussion, I am prescribing you an antibiotic ointment called erythromycin.  You are going to apply this to the lower portion of the left eyelid 4 times per day.  You are going to take this for the next week.  If your symptoms worsen or do not improve please return to the emergency department for reevaluation.  It was a pleasure to meet you.

## 2020-06-09 ENCOUNTER — Other Ambulatory Visit: Payer: Self-pay

## 2020-06-09 ENCOUNTER — Emergency Department (HOSPITAL_COMMUNITY): Payer: Self-pay

## 2020-06-09 ENCOUNTER — Emergency Department (HOSPITAL_COMMUNITY)
Admission: EM | Admit: 2020-06-09 | Discharge: 2020-06-09 | Disposition: A | Discharge status: Home / Self Care | Payer: Self-pay | Attending: Emergency Medicine | Admitting: Emergency Medicine

## 2020-06-09 DIAGNOSIS — F17200 Nicotine dependence, unspecified, uncomplicated: Secondary | ICD-10-CM | POA: Insufficient documentation

## 2020-06-09 DIAGNOSIS — M79672 Pain in left foot: Secondary | ICD-10-CM | POA: Insufficient documentation

## 2020-06-09 DIAGNOSIS — W19XXXA Unspecified fall, initial encounter: Secondary | ICD-10-CM | POA: Insufficient documentation

## 2020-06-09 MED ORDER — NAPROXEN 500 MG PO TABS: 500.0000 mg | ORAL_TABLET | Freq: Two times a day (BID) | ORAL | 0 refills | Status: AC

## 2020-06-09 MED ORDER — LIDOCAINE 5 % EX PTCH: 1.0000 | MEDICATED_PATCH | CUTANEOUS | 0 refills | Status: AC

## 2020-06-09 NOTE — Discharge Instructions (Signed)
Take the medication as prescribed.  Return for new or worsening symptoms. 
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
## 2020-06-09 NOTE — ED Triage Notes (Signed)
Pt presents to the ED with bilat hand pain, bilat knee pain, back pain, and left foot pain for "weeks and weeks" unknown trauma. Pt ambulatory with steady gait.

## 2021-02-03 ENCOUNTER — Encounter (HOSPITAL_COMMUNITY): Payer: Self-pay

## 2021-02-03 ENCOUNTER — Other Ambulatory Visit: Payer: Self-pay

## 2021-02-03 ENCOUNTER — Emergency Department (HOSPITAL_COMMUNITY)
Admission: EM | Admit: 2021-02-03 | Discharge: 2021-02-03 | Discharge status: Against Medical Advice | Payer: Self-pay | Attending: Emergency Medicine | Admitting: Emergency Medicine

## 2021-02-03 ENCOUNTER — Emergency Department (HOSPITAL_COMMUNITY): Payer: Self-pay

## 2021-02-03 DIAGNOSIS — Z5321 Procedure and treatment not carried out due to patient leaving prior to being seen by health care provider: Secondary | ICD-10-CM | POA: Insufficient documentation

## 2021-02-03 DIAGNOSIS — R0602 Shortness of breath: Secondary | ICD-10-CM | POA: Insufficient documentation

## 2021-02-03 DIAGNOSIS — R0981 Nasal congestion: Secondary | ICD-10-CM | POA: Insufficient documentation

## 2021-02-03 DIAGNOSIS — R059 Cough, unspecified: Secondary | ICD-10-CM | POA: Insufficient documentation

## 2021-02-03 LAB — COMPREHENSIVE METABOLIC PANEL
ALT: 25 U/L (ref 0–44)
AST: 29 U/L (ref 15–41)
Albumin: 3.8 g/dL (ref 3.5–5.0)
Alkaline Phosphatase: 44 U/L (ref 38–126)
Anion gap: 10 (ref 5–15)
BUN: 9 mg/dL (ref 6–20)
CO2: 27 mmol/L (ref 22–32)
Calcium: 9.2 mg/dL (ref 8.9–10.3)
Chloride: 100 mmol/L (ref 98–111)
Creatinine, Ser: 1.26 mg/dL — ABNORMAL HIGH (ref 0.61–1.24)
GFR, Estimated: >60 mL/min (ref >60)
Glucose, Bld: 116 mg/dL — ABNORMAL HIGH (ref 70–99)
Potassium: 3.9 mmol/L (ref 3.5–5.1)
Sodium: 137 mmol/L (ref 135–145)
Total Bilirubin: 0.8 mg/dL (ref 0.3–1.2)
Total Protein: 7.6 g/dL (ref 6.5–8.1)

## 2021-02-03 LAB — CBC WITH DIFFERENTIAL/PLATELET
Abs Immature Granulocytes: 0.02 10*3/uL (ref 0.00–0.07)
Basophils Absolute: 0 10*3/uL (ref 0.0–0.1)
Basophils Relative: 0 %
Eosinophils Absolute: 0 10*3/uL (ref 0.0–0.5)
Eosinophils Relative: 0 %
HCT: 45.6 % (ref 39.0–52.0)
Hemoglobin: 15.3 g/dL (ref 13.0–17.0)
Immature Granulocytes: 0 %
Lymphocytes Relative: 15 %
Lymphs Abs: 1.1 10*3/uL (ref 0.7–4.0)
MCH: 28.9 pg (ref 26.0–34.0)
MCHC: 33.6 g/dL (ref 30.0–36.0)
MCV: 86 fL (ref 80.0–100.0)
Monocytes Absolute: 1 10*3/uL (ref 0.1–1.0)
Monocytes Relative: 13 %
Neutro Abs: 5.3 10*3/uL (ref 1.7–7.7)
Neutrophils Relative %: 72 %
Platelets: 116 10*3/uL — ABNORMAL LOW (ref 150–400)
RBC: 5.3 MIL/uL (ref 4.22–5.81)
RDW: 13.2 % (ref 11.5–15.5)
WBC: 7.4 10*3/uL (ref 4.0–10.5)
nRBC: 0 % (ref 0.0–0.2)

## 2021-02-03 LAB — TROPONIN I (HIGH SENSITIVITY): Troponin I (High Sensitivity): 9 ng/L (ref <18)

## 2021-02-03 MED ORDER — IPRATROPIUM-ALBUTEROL 0.5-2.5 (3) MG/3ML IN SOLN: 3.0000 mL | Freq: Once | RESPIRATORY_TRACT | Status: DC

## 2021-02-03 NOTE — ED Triage Notes (Signed)
Patient complains of 3 days of cough and congestion with purulent drainage. Patient alert and no distress

## 2021-02-03 NOTE — ED Notes (Signed)
Pt called for repeat trop, no answer

## 2021-02-03 NOTE — ED Notes (Signed)
Pt called for a room, no answer ?

## 2021-02-03 NOTE — ED Provider Notes (Signed)
Emergency Medicine Provider Triage Evaluation Note  Kurt Ramirez , a 56 y.o. male  was evaluated in triage.  Pt complains of shortness of breath. States that same began 2 days ago. Hx of asthma, but states he has not had symptoms in several years therefore he does not have an inhaler. Also endorses subjective fevers, cough productive of green sputum, with congestion and rhinorrhea. Denies chest pain, n/v/d.  Review of Systems  Positive:  Negative: See above  Physical Exam  BP (!) 139/95 (BP Location: Right Arm)    Pulse (!) 108    Temp 99.8 F (37.7 C) (Oral)    Resp 19    SpO2 98%  Gen:   Awake, no distress   Resp:  Normal effort, some wheezing present throughout MSK:   Moves extremities without difficulty  Other:    Medical Decision Making  Medically screening exam initiated at 10:51 AM.  Appropriate orders placed.  Kurt Ramirez was informed that the remainder of the evaluation will be completed by another provider, this initial triage assessment does not replace that evaluation, and the importance of remaining in the ED until their evaluation is complete.     Vear Clock 02/03/21 1053    Margarita Grizzle, MD 02/03/21 [phone]

## 2023-03-19 IMAGING — DX DG CHEST 2V
2 series · 2 of 2 positions shown · non-contrast
Comparison: 12/24/2017

CLINICAL DATA: Shortness of breath, cough

EXAM:
CHEST - 2 VIEW

[chest pa]
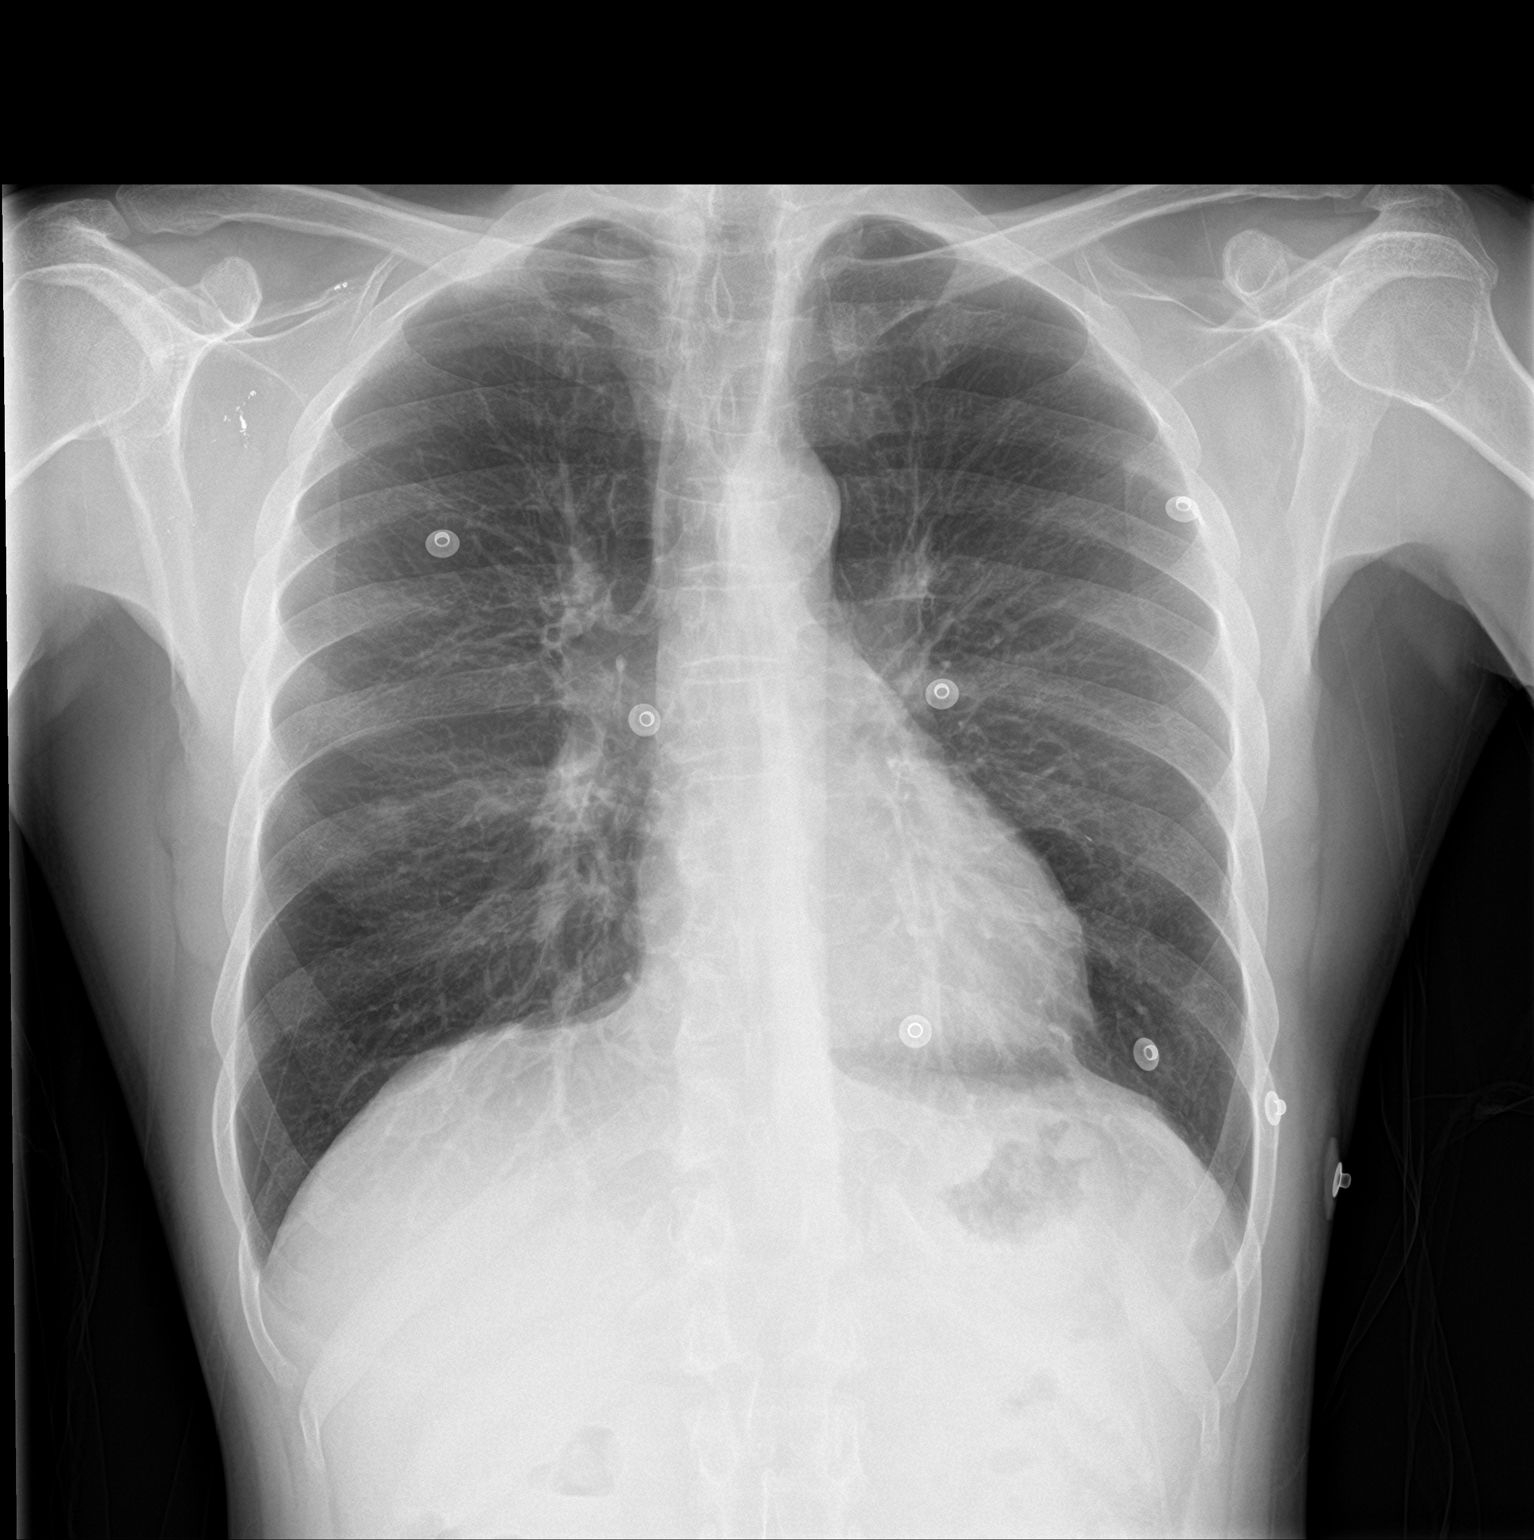

[chest lat]
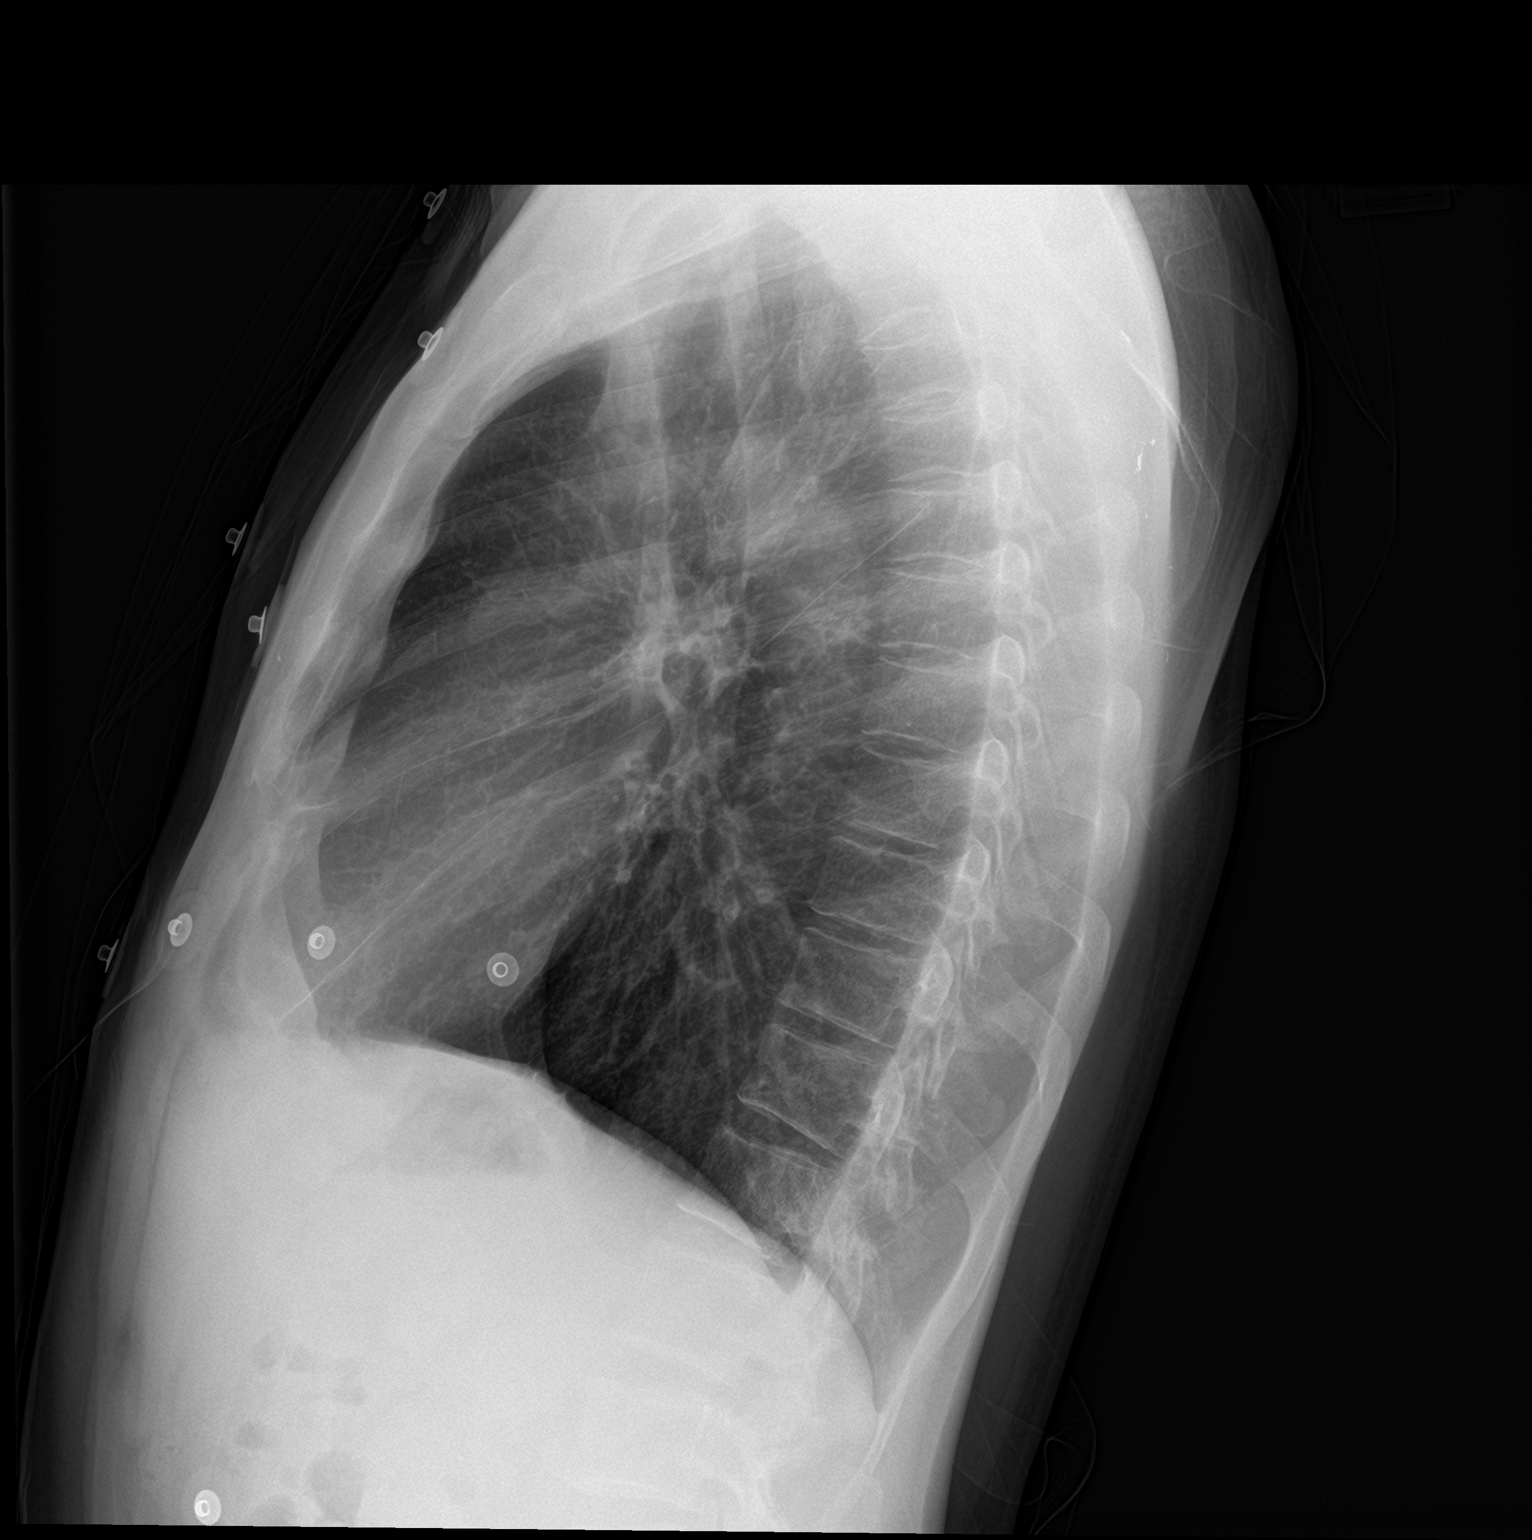

[2 of 2 positions shown; findings below may reference images not displayed]

FINDINGS: Cardiac size is within normal limits. Lung fields are clear of any
infiltrate or pulmonary edema. There is peribronchial thickening.
There is no pleural effusion or pneumothorax. Are small metallic
densities in the right shoulder with no significant interval change.
IMPRESSION: Peribronchial thickening suggests bronchitis. There is no focal
pulmonary consolidation. There is no pleural effusion.
